# Patient Record
Sex: Female | Born: 1950 | Race: Black or African American | Hispanic: No | Smoking: Never smoker
Health system: Southern US, Community
[De-identification: ages and names within clinical notes are randomized; demographics above are authoritative.]

## PROBLEM LIST (undated history)

## (undated) DIAGNOSIS — F32A Depression, unspecified: Secondary | ICD-10-CM

## (undated) DIAGNOSIS — F329 Major depressive disorder, single episode, unspecified: Secondary | ICD-10-CM

## (undated) DIAGNOSIS — D649 Anemia, unspecified: Secondary | ICD-10-CM

## (undated) DIAGNOSIS — I639 Cerebral infarction, unspecified: Secondary | ICD-10-CM

## (undated) DIAGNOSIS — E785 Hyperlipidemia, unspecified: Secondary | ICD-10-CM

## (undated) DIAGNOSIS — D473 Essential (hemorrhagic) thrombocythemia: Secondary | ICD-10-CM

## (undated) DIAGNOSIS — I10 Essential (primary) hypertension: Secondary | ICD-10-CM

## (undated) DIAGNOSIS — E669 Obesity, unspecified: Secondary | ICD-10-CM

## (undated) DIAGNOSIS — M109 Gout, unspecified: Secondary | ICD-10-CM

## (undated) DIAGNOSIS — J9691 Respiratory failure, unspecified with hypoxia: Secondary | ICD-10-CM

## (undated) DIAGNOSIS — N179 Acute kidney failure, unspecified: Secondary | ICD-10-CM

## (undated) DIAGNOSIS — R5381 Other malaise: Secondary | ICD-10-CM

## (undated) DIAGNOSIS — R7303 Prediabetes: Secondary | ICD-10-CM

## (undated) DIAGNOSIS — E559 Vitamin D deficiency, unspecified: Secondary | ICD-10-CM

## (undated) DIAGNOSIS — R9431 Abnormal electrocardiogram [ECG] [EKG]: Secondary | ICD-10-CM

## (undated) DIAGNOSIS — M79609 Pain in unspecified limb: Secondary | ICD-10-CM

## (undated) DIAGNOSIS — R5383 Other fatigue: Secondary | ICD-10-CM

## (undated) DIAGNOSIS — N289 Disorder of kidney and ureter, unspecified: Secondary | ICD-10-CM

## (undated) DIAGNOSIS — R000 Tachycardia, unspecified: Secondary | ICD-10-CM

## (undated) DIAGNOSIS — E8881 Metabolic syndrome: Secondary | ICD-10-CM

## (undated) DIAGNOSIS — N189 Chronic kidney disease, unspecified: Secondary | ICD-10-CM

## (undated) DIAGNOSIS — J324 Chronic pansinusitis: Secondary | ICD-10-CM

## (undated) HISTORY — DX: Gout, unspecified: M10.9

## (undated) HISTORY — DX: Depression, unspecified: F32.A

## (undated) HISTORY — DX: Hyperlipidemia, unspecified: E78.5

## (undated) HISTORY — DX: Major depressive disorder, single episode, unspecified: F32.9

## (undated) HISTORY — DX: Metabolic syndrome: E88.81

## (undated) HISTORY — DX: Essential (hemorrhagic) thrombocythemia: D47.3

## (undated) HISTORY — PX: TOTAL VAGINAL HYSTERECTOMY: SHX2548

## (undated) HISTORY — DX: Obesity, unspecified: E66.9

## (undated) HISTORY — DX: Prediabetes: R73.03

## (undated) HISTORY — DX: Other fatigue: R53.83

## (undated) HISTORY — DX: Cerebral infarction, unspecified: I63.9

## (undated) HISTORY — DX: Essential (primary) hypertension: I10

## (undated) HISTORY — DX: Pain in unspecified limb: M79.609

## (undated) HISTORY — DX: Other malaise: R53.81

## (undated) HISTORY — DX: Respiratory failure, unspecified with hypoxia: J96.91

## (undated) HISTORY — DX: Chronic pansinusitis: J32.4

## (undated) HISTORY — DX: Tachycardia, unspecified: R00.0

## (undated) HISTORY — DX: Anemia, unspecified: D64.9

## (undated) HISTORY — DX: Abnormal electrocardiogram (ECG) (EKG): R94.31

## (undated) HISTORY — DX: Chronic kidney disease, unspecified: N18.9

## (undated) HISTORY — DX: Disorder of kidney and ureter, unspecified: N28.9

## (undated) HISTORY — DX: Vitamin D deficiency, unspecified: E55.9

## (undated) HISTORY — DX: Acute kidney failure, unspecified: N17.9

---

## 2002-02-05 ENCOUNTER — Inpatient Hospital Stay (HOSPITAL_COMMUNITY): Admission: RE | Admit: 2002-02-05 | Discharge: 2002-02-08 | Payer: Self-pay | Admitting: Family Medicine

## 2002-02-06 ENCOUNTER — Encounter: Payer: Self-pay | Admitting: Family Medicine

## 2002-02-07 ENCOUNTER — Encounter: Payer: Self-pay | Admitting: Cardiology

## 2002-03-06 ENCOUNTER — Ambulatory Visit (HOSPITAL_COMMUNITY): Admission: RE | Admit: 2002-03-06 | Discharge: 2002-03-06 | Payer: Self-pay | Admitting: Cardiology

## 2002-03-06 ENCOUNTER — Encounter: Payer: Self-pay | Admitting: Cardiology

## 2005-12-06 ENCOUNTER — Ambulatory Visit: Payer: Self-pay | Admitting: Family Medicine

## 2005-12-26 ENCOUNTER — Ambulatory Visit (HOSPITAL_COMMUNITY): Admission: RE | Admit: 2005-12-26 | Discharge: 2005-12-26 | Payer: Self-pay | Admitting: Family Medicine

## 2006-07-05 ENCOUNTER — Ambulatory Visit (HOSPITAL_COMMUNITY): Admission: RE | Admit: 2006-07-05 | Discharge: 2006-07-05 | Payer: Self-pay | Admitting: Family Medicine

## 2006-08-23 ENCOUNTER — Ambulatory Visit: Payer: Self-pay | Admitting: Family Medicine

## 2006-12-28 ENCOUNTER — Emergency Department (HOSPITAL_COMMUNITY): Admission: EM | Admit: 2006-12-28 | Discharge: 2006-12-28 | Payer: Self-pay | Admitting: Emergency Medicine

## 2007-02-13 ENCOUNTER — Ambulatory Visit: Payer: Self-pay | Admitting: Family Medicine

## 2007-08-08 ENCOUNTER — Encounter: Payer: Self-pay | Admitting: Family Medicine

## 2007-08-08 ENCOUNTER — Ambulatory Visit (HOSPITAL_COMMUNITY): Admission: RE | Admit: 2007-08-08 | Discharge: 2007-08-08 | Payer: Self-pay | Admitting: Family Medicine

## 2007-08-08 LAB — CONVERTED CEMR LAB
Albumin: 4.1 g/dL (ref 3.5–5.2)
Basophils Absolute: 0 10*3/uL (ref 0.0–0.1)
Bilirubin, Direct: 0.1 mg/dL (ref 0.0–0.3)
CO2: 22 meq/L (ref 19–32)
Calcium: 9.1 mg/dL (ref 8.4–10.5)
Chloride: 103 meq/L (ref 96–112)
Eosinophils Relative: 4 % (ref 0–5)
Glucose, Bld: 85 mg/dL (ref 70–99)
HCT: 32.9 % — ABNORMAL LOW (ref 36.0–46.0)
HDL: 46 mg/dL (ref >39)
Hemoglobin: 10.3 g/dL — ABNORMAL LOW (ref 12.0–15.0)
LDL Cholesterol: 114 mg/dL — ABNORMAL HIGH (ref 0–99)
Lymphocytes Relative: 33 % (ref 12–46)
Lymphs Abs: 1.6 10*3/uL (ref 0.7–3.3)
Neutro Abs: 2.4 10*3/uL (ref 1.7–7.7)
Platelets: 340 10*3/uL (ref 150–400)
RDW: 15 % — ABNORMAL HIGH (ref 11.5–14.0)
Sodium: 138 meq/L (ref 135–145)
TSH: 1.727 microintl units/mL (ref 0.350–5.50)
Total Bilirubin: 0.3 mg/dL (ref 0.3–1.2)
Total CHOL/HDL Ratio: 4
VLDL: 24 mg/dL (ref 0–40)
WBC: 4.7 10*3/uL (ref 4.0–10.5)

## 2007-08-09 ENCOUNTER — Encounter: Payer: Self-pay | Admitting: Family Medicine

## 2007-08-09 LAB — CONVERTED CEMR LAB
Iron: 61 ug/dL (ref 42–145)
Saturation Ratios: 20 % (ref 20–55)
TIBC: 304 ug/dL (ref 250–470)
UIBC: 243 ug/dL

## 2007-08-22 ENCOUNTER — Ambulatory Visit: Payer: Self-pay | Admitting: Family Medicine

## 2007-11-22 ENCOUNTER — Encounter: Payer: Self-pay | Admitting: Family Medicine

## 2008-02-28 ENCOUNTER — Telehealth (INDEPENDENT_AMBULATORY_CARE_PROVIDER_SITE_OTHER): Payer: Self-pay | Admitting: Internal Medicine

## 2008-03-07 ENCOUNTER — Ambulatory Visit: Payer: Self-pay | Admitting: Family Medicine

## 2008-03-13 DIAGNOSIS — E669 Obesity, unspecified: Secondary | ICD-10-CM | POA: Insufficient documentation

## 2008-03-13 DIAGNOSIS — I10 Essential (primary) hypertension: Secondary | ICD-10-CM

## 2008-03-13 DIAGNOSIS — F3289 Other specified depressive episodes: Secondary | ICD-10-CM

## 2008-03-13 DIAGNOSIS — E785 Hyperlipidemia, unspecified: Secondary | ICD-10-CM | POA: Insufficient documentation

## 2008-03-13 DIAGNOSIS — F329 Major depressive disorder, single episode, unspecified: Secondary | ICD-10-CM

## 2008-03-13 HISTORY — DX: Major depressive disorder, single episode, unspecified: F32.9

## 2008-03-13 HISTORY — DX: Essential (primary) hypertension: I10

## 2008-03-13 HISTORY — DX: Other specified depressive episodes: F32.89

## 2008-03-13 HISTORY — DX: Obesity, unspecified: E66.9

## 2009-02-25 ENCOUNTER — Ambulatory Visit: Payer: Self-pay | Admitting: Family Medicine

## 2009-02-25 DIAGNOSIS — M79609 Pain in unspecified limb: Secondary | ICD-10-CM

## 2009-02-25 DIAGNOSIS — R5381 Other malaise: Secondary | ICD-10-CM

## 2009-02-25 DIAGNOSIS — R5383 Other fatigue: Secondary | ICD-10-CM

## 2009-02-25 HISTORY — DX: Other malaise: R53.81

## 2009-02-25 HISTORY — DX: Pain in unspecified limb: M79.609

## 2009-10-13 ENCOUNTER — Encounter: Payer: Self-pay | Admitting: Family Medicine

## 2010-02-04 ENCOUNTER — Telehealth: Payer: Self-pay | Admitting: Family Medicine

## 2010-02-06 ENCOUNTER — Encounter: Payer: Self-pay | Admitting: Family Medicine

## 2010-02-11 ENCOUNTER — Encounter: Payer: Self-pay | Admitting: Family Medicine

## 2010-02-11 LAB — CONVERTED CEMR LAB
ALT: 11 units/L (ref 0–35)
AST: 14 units/L (ref 0–37)
BUN: 22 mg/dL (ref 6–23)
Basophils Absolute: 0 10*3/uL (ref 0.0–0.1)
Basophils Relative: 0 % (ref 0–1)
Bilirubin, Direct: <0.1 mg/dL (ref 0.0–0.3)
Calcium: 9 mg/dL (ref 8.4–10.5)
Cholesterol: 194 mg/dL (ref 0–200)
Glucose, Bld: 85 mg/dL (ref 70–99)
Hemoglobin: 10.1 g/dL — ABNORMAL LOW (ref 12.0–15.0)
Lymphocytes Relative: 34 % (ref 12–46)
MCHC: 30.9 g/dL (ref 30.0–36.0)
Monocytes Absolute: 0.7 10*3/uL (ref 0.1–1.0)
Neutro Abs: 2.5 10*3/uL (ref 1.7–7.7)
Platelets: 319 10*3/uL (ref 150–400)
RDW: 15.6 % — ABNORMAL HIGH (ref 11.5–15.5)
Sodium: 138 meq/L (ref 135–145)
Total CHOL/HDL Ratio: 4.5
Total Protein: 8.1 g/dL (ref 6.0–8.3)
Triglycerides: 120 mg/dL (ref <150)

## 2010-02-12 ENCOUNTER — Ambulatory Visit: Payer: Self-pay | Admitting: Family Medicine

## 2010-05-19 ENCOUNTER — Telehealth: Payer: Self-pay | Admitting: Family Medicine

## 2010-06-30 ENCOUNTER — Encounter: Payer: Self-pay | Admitting: Gastroenterology

## 2010-07-22 ENCOUNTER — Ambulatory Visit (HOSPITAL_COMMUNITY): Admission: RE | Admit: 2010-07-22 | Discharge: 2010-07-22 | Payer: Self-pay | Admitting: Family Medicine

## 2010-11-11 ENCOUNTER — Ambulatory Visit: Payer: Self-pay | Admitting: Family Medicine

## 2010-11-12 ENCOUNTER — Encounter: Payer: Self-pay | Admitting: Family Medicine

## 2010-11-15 ENCOUNTER — Encounter: Payer: Self-pay | Admitting: Family Medicine

## 2010-11-17 ENCOUNTER — Encounter: Payer: Self-pay | Admitting: Family Medicine

## 2010-11-18 LAB — CONVERTED CEMR LAB
Bacteria, UA: NONE SEEN
Basophils Absolute: 0 10*3/uL (ref 0.0–0.1)
Basophils Relative: 0 % (ref 0–1)
Calcium: 9.5 mg/dL (ref 8.4–10.5)
Casts: NONE SEEN /lpf
Cholesterol: 221 mg/dL — ABNORMAL HIGH (ref 0–200)
Creatinine, Ser: 1.18 mg/dL (ref 0.40–1.20)
Crystals: NONE SEEN
Eosinophils Absolute: 0.5 10*3/uL (ref 0.0–0.7)
Eosinophils Relative: 7 % — ABNORMAL HIGH (ref 0–5)
HCT: 34.9 % — ABNORMAL LOW (ref 36.0–46.0)
HDL: 46 mg/dL (ref >39)
Hemoglobin: 10.9 g/dL — ABNORMAL LOW (ref 12.0–15.0)
Hgb A1c MFr Bld: 6.4 % — ABNORMAL HIGH (ref <5.7)
Ketones, ur: NEGATIVE mg/dL
MCHC: 31.2 g/dL (ref 30.0–36.0)
Monocytes Absolute: 0.9 10*3/uL (ref 0.1–1.0)
Nitrite: NEGATIVE
RDW: 15.6 % — ABNORMAL HIGH (ref 11.5–15.5)
Specific Gravity, Urine: 1.016 (ref 1.005–1.030)
Triglycerides: 120 mg/dL (ref <150)
pH: 7 (ref 5.0–8.0)

## 2010-12-01 ENCOUNTER — Encounter: Payer: Self-pay | Admitting: Family Medicine

## 2010-12-11 ENCOUNTER — Encounter: Payer: Self-pay | Admitting: Family Medicine

## 2010-12-12 ENCOUNTER — Encounter: Payer: Self-pay | Admitting: Family Medicine

## 2010-12-21 NOTE — Assessment & Plan Note (Signed)
Summary: OV   Vital Signs:  Patient profile:   60 year old female Menstrual status:  hysterectomy Height:      64 inches Weight:      269 pounds BMI:     46.34 O2 Sat:      95 % Pulse rate:   120 / minute Pulse rhythm:   regular Resp:     16 per minute BP sitting:   140 / 98  (left arm) Cuff size:   xl   Vitals Entered By: Kate Sable LPN (March 25, 624THL [id number] AM)  Nutrition Counseling: Patient's BMI is greater than 25 and therefore counseled on weight management options. CC: Follow up chronic problems   CC:  Follow up chronic problems.  History of Present Illness: Reports  that sh has been  doing well. she is concerned about changing her lifestyle to incorporate regular exercise and ditary change to facilitate imprved blood pressure and weight los. Denies recent fever or chills. Denies sinus pressure, nasal congestion , ear pain or sore throat. Denies chest congestion, or cough productive of sputum. Denies chest pain, palpitations, PND, orthopnea or leg swelling. Denies abdominal pain, nausea, vomitting, diarrhea or constipation. Denies change in bowel movements or bloody stool. Denies dysuria , frequency, incontinence or hesitancy. Denies  joint pain, swelling, or reduced mobility. Denies headaches, vertigo, seizures. Denies depression, anxiety or insomnia. Denies  rash, lesions, or itch.     Current Medications (verified): 1)  Nifedipine 90 Mg  Tb24 (Nifedipine) .... Take 1 Tablet By Mouth Once A Day 2)  Paroxetine Hcl 20 Mg  Tabs (Paroxetine Hcl) .... Take 1 Tablet By Mouth Once A Day 3)  Maxzide 75-50 Mg Tabs (Triamterene-Hctz) .... Take 1 Tablet By Mouth Once A Day 4)  Oscal 500/200 D-3 500-200 Mg-Unit Tabs (Calcium-Vitamin D) .... Take 1 Tablet By Mouth Three Times A Day  Allergies (verified): No Known Drug Allergies  Past History:  Past medical, surgical, family and social histories (including risk factors) reviewed for relevance to current acute and  chronic problems.  Past Medical History: Reviewed history from 03/13/2008 and no changes required. DEPRESSION (ICD-311) DYSLIPIDEMIA (ICD-272.4) OBESITY (ICD-278.00) HYPERTENSION (ICD-401.9)  Past Surgical History: Reviewed history from 03/13/2008 and no changes required. TAH & BSO 1987 BTL 1989  Family History: Reviewed history from 03/13/2008 and no changes required. Mom-deceased CHF,CVA,MI Dad-deceased MI HTN x1 Sisters x3, 1 sister deceased-breast cancer Brothers X7 , 1 brother deceased unknown cause  Social History: Reviewed history from 03/13/2008 and no changes required. Divorced employed-secretary Never Smoked Alcohol use-no Drug use-no  Review of Systems      See HPI General:  Complains of fatigue and sleep disorder. Eyes:  Denies blurring, discharge, and red eye. Heme:  Denies abnormal bruising and bleeding. Allergy:  Complains of seasonal allergies.  Physical Exam  General:  Well-developed,obese,in no acute distress; alert,appropriate and cooperative throughout examination HEENT: No facial asymmetry,  EOMI, No sinus tenderness, TM's Clear, oropharynx  pink and moist.   Chest: Clear to auscultation bilaterally.  CVS: S1, S2, No murmurs, No S3.   Abd: Soft, Nontender.  MS: Adequate ROM spine, hips, shoulders and knees.  Ext: No edema.   CNS: CN 2-12 intact, power tone and sensation normal throughout.   Skin: Intact, no visible lesions or rashes.  Psych: Good eye contact, normal affect.  Memory intact, not anxious or depressed appearing.    Impression & Recommendations:  Problem # 1:  SPECIAL SCREENING FOR MALIGNANT NEOPLASMS COLON (ICD-V76.51) Assessment Comment  Only  Orders: Gastroenterology Referral (GI)pt has never had a colonscopy , states she intends to have one at this time  Problem # 2:  DEPRESSION (ICD-311) Assessment: Improved  Her updated medication list for this problem includes:    Paroxetine Hcl 20 Mg Tabs (Paroxetine hcl) .Marland Kitchen...  Take 1 tablet by mouth once a day  Problem # 3:  OBESITY (ICD-278.00) Assessment: Unchanged  Ht: 64 (02/12/2010)   Wt: 269 (02/12/2010)   BMI: 46.34 (02/12/2010)  Problem # 4:  HYPERTENSION (ICD-401.9) Assessment: Deteriorated  Her updated medication list for this problem includes:    Nifedipine 90 Mg Tb24 (Nifedipine) .Marland Kitchen... Take 1 tablet by mouth once a day    Maxzide 75-50 Mg Tabs (Triamterene-hctz) .Marland Kitchen... Take 1 tablet by mouth once a day    Clonidine Hcl 0.1 Mg Tabs (Clonidine hcl) .Marland Kitchen... Take 1 tab by mouth at bedtime, new addution  BP today: 140/98 Prior BP: 142/90 (02/25/2009)  Labs Reviewed: K+: 3.8 (02/06/2010) Creat: : 1.34 (02/06/2010)   Chol: 194 (02/06/2010)   HDL: 43 (02/06/2010)   LDL: 127 (02/06/2010)   TG: 120 (02/06/2010)  Problem # 5:  OTHER SCREENING MAMMOGRAM (ICD-V76.12) Assessment: Comment Only  Orders: Radiology Referral (Radiology)mamo past due  Complete Medication List: 1)  Nifedipine 90 Mg Tb24 (Nifedipine) .... Take 1 tablet by mouth once a day 2)  Paroxetine Hcl 20 Mg Tabs (Paroxetine hcl) .... Take 1 tablet by mouth once a day 3)  Maxzide 75-50 Mg Tabs (Triamterene-hctz) .... Take 1 tablet by mouth once a day 4)  Clonidine Hcl 0.1 Mg Tabs (Clonidine hcl) .... Take 1 tab by mouth at bedtime  Patient Instructions: 1)  CPE in 3 months and 3 weeks. 2)  You are being referred for [facility] and colonscopy. 3)  Pls start OTC calcium 1200mg  daily with vith Vit D daily. 4)  Asprin 81mg  daily. 5)  It is important that you exercise regularly at least 20 minutes 5 to 6 times a week. If you develop chest pain, have severe difficulty breathing, or feel very tired , stop exercising immediately and seek medical attention. 6)  You need to lose weight. Consider a lower calorie diet and regular exercise. Goal is 6 pounds. Prescriptions: PAROXETINE HCL 20 MG  TABS (PAROXETINE HCL) Take 1 tablet by mouth once a day  #30 x 3   Entered by:   Kate Sable LPN   Authorized  by:   Tula Nakayama MD   Signed by:   Kate Sable LPN on [id number]   Method used:   Electronically to        [facility] (retail)       [address]       [place], [place]  [address]       Ph: [phone]       Fax: [phone]   [id number] CLONIDINE HCL 0.1 MG TABS (CLONIDINE HCL) Take 1 tab by mouth at bedtime  #30 x 3   Entered and Authorized by:   Tula Nakayama MD   Signed by:   Tula Nakayama MD on 02/12/2010   Method used:   Electronically to        [facility] (retail)       [address]       [place], [place]  [address]       Ph: [phone]  Fax: [phone]   RxIDVR:[id number]

## 2010-12-21 NOTE — Letter (Signed)
Summary: OFFICE NOTES  OFFICE NOTES   Imported By: Dierdre Harness 04/21/2010 15:36:10  _____________________________________________________________________  External Attachment:    Type:   Image     Comment:   External Document

## 2010-12-21 NOTE — Letter (Signed)
Summary: DEMO  DEMO   Imported By: Dierdre Harness 04/21/2010 15:34:41  _____________________________________________________________________  External Attachment:    Type:   Image     Comment:   External Document

## 2010-12-21 NOTE — Progress Notes (Signed)
Summary: RX  Phone Note Call from Patient   Summary of Call: [facility] HAS NOT RECEIVEDHER RX FOR NIFEDIPINE AND TRIAMT [facility] IN [place] Initial call taken by: Dierdre Harness,  May 19, 2010 9:10 AM  Follow-up for Phone Call        Rx Called In Follow-up by: Baldomero Lamy LPN,  June 29, 624THL [id number] AM    Prescriptions: MAXZIDE 75-50 MG TABS (TRIAMTERENE-HCTZ) Take 1 tablet by mouth once a day  #30 x 0   Entered by:   Baldomero Lamy LPN   Authorized by:   Tula Nakayama MD   Signed by:   Baldomero Lamy LPN on 075-GRM   Method used:   Electronically to        [facility] (retail)       [address]       [place], [place]  [address]       Ph: [phone]       Fax: [phone]   [id number] NIFEDIPINE 90 MG  TB24 (NIFEDIPINE) Take 1 tablet by mouth once a day  #30 x 0   Entered by:   Baldomero Lamy LPN   Authorized by:   Tula Nakayama MD   Signed by:   Baldomero Lamy LPN on 075-GRM   Method used:   Electronically to        [facility]  [address] (retail)       [address]       [place], [place]  [address]       Ph: [phone]       Fax: [phone]   [id number]

## 2010-12-21 NOTE — Letter (Signed)
Summary: consults  consults   Imported By: Dierdre Harness 04/21/2010 15:41:46  _____________________________________________________________________  External Attachment:    Type:   Image     Comment:   External Document

## 2010-12-21 NOTE — Letter (Signed)
Summary: HISTORY AND PHY  HISTORY AND PHY   Imported By: Dierdre Harness 04/21/2010 15:35:40  _____________________________________________________________________  External Attachment:    Type:   Image     Comment:   External Document

## 2010-12-21 NOTE — Letter (Signed)
Summary: LABS  LABS   Imported By: Dierdre Harness 04/21/2010 15:42:27  _____________________________________________________________________  External Attachment:    Type:   Image     Comment:   External Document

## 2010-12-21 NOTE — Progress Notes (Signed)
Summary: LAB ORDERS  Phone Note Call from Patient   Summary of Call: DR WANTS HER TO COME IN SO SHE IS COMING NEXT WEEK AND NEEDS HER LAB FAXED OVER BECAUSE SHE IS GOING OVER THERE SATURDAY CALL HER BACK AT [phone] TO LET HER [place] Initial call taken by: Dierdre Harness,  February 04, 2010 11:40 AM  Follow-up for Phone Call        order sent and patient aware Follow-up by: Baldomero Lamy LPN,  March 17, 624THL 11:47 AM

## 2010-12-21 NOTE — Letter (Signed)
Summary: MISC  MISC   Imported By: Dierdre Harness 04/21/2010 15:42:57  _____________________________________________________________________  External Attachment:    Type:   Image     Comment:   External Document

## 2010-12-21 NOTE — Letter (Signed)
Summary: PHONE NOTES  PHONE NOTES   Imported By: Dierdre Harness 04/21/2010 15:43:30  _____________________________________________________________________  External Attachment:    Type:   Image     Comment:   External Document

## 2010-12-21 NOTE — Letter (Signed)
Summary: Internal Other Lynne Logan  Internal Other Lynne Logan   Imported By: Waldon Merl LPN 624THL [id number]  _____________________________________________________________________  External Attachment:    Type:   Image     Comment:   External Document

## 2010-12-23 NOTE — Letter (Signed)
Summary: lab add on  lab add on   Imported By: Luann Bullins 12/01/2010 13:22:00  _____________________________________________________________________  External Attachment:    Type:   Image     Comment:   External Document

## 2010-12-23 NOTE — Assessment & Plan Note (Signed)
Summary: ov   Vital Signs:  Patient profile:   60 year old female Menstrual status:  hysterectomy Height:      64 inches Weight:      261.50 pounds BMI:     45.05 O2 Sat:      97 % on Room air Pulse rate:   101 / minute Pulse rhythm:   regular BP sitting:   136 / 84  (left arm)  Vitals Entered By: Baldomero Lamy LPN (December 22, 624THL 9:26 AM)  Nutrition Counseling: Patient's BMI is greater than 25 and therefore counseled on weight management options.  O2 Flow:  Room air CC: follow-up visit Is Patient Diabetic? No   CC:  follow-up visit.  History of Present Illness: Reports  that t she has been doing fairly well. Denies recent fever or chills. Denies sinus pressure, nasal congestion , ear pain or sore throat. Denies chest congestion, or cough productive of sputum. Denies chest pain, palpitations, PND, orthopnea or leg swelling. Denies abdominal pain, nausea, vomitting, diarrhea or constipation. Denies change in bowel movements or bloody stool. Denies dysuria , frequency, incontinence or hesitancy. Denies  joint pain, swelling, or reduced mobility. Denies headaches, vertigo, seizures. Denies depression, anxiety or insomnia.Controlled on meds. Denies  rash, lesions, or itch.     Current Medications (verified): 1)  Nifedipine 90 Mg  Tb24 (Nifedipine) .... Take 1 Tablet By Mouth Once A Day 2)  Paroxetine Hcl 20 Mg  Tabs (Paroxetine Hcl) .... Take 1 Tablet By Mouth Once A Day 3)  Maxzide 75-50 Mg Tabs (Triamterene-Hctz) .... Take 1 Tablet By Mouth Once A Day 4)  Aspir-Low 81 Mg Tbec (Aspirin) .... One Tab By Mouth Once Daily  Allergies (verified): No Known Drug Allergies  Review of Systems      See HPI Eyes:  Denies discharge, eye pain, and red eye. Endo:  Denies cold intolerance, excessive hunger, excessive thirst, and excessive urination. Heme:  Denies abnormal bruising and bleeding. Allergy:  Denies hives or rash and itching eyes.  Physical Exam  General:   Well-developed,obese,in no acute distress; alert,appropriate and cooperative throughout examination HEENT: No facial asymmetry,  EOMI, No sinus tenderness, TM's Clear, oropharynx  pink and moist.   Chest: Clear to auscultation bilaterally.  CVS: S1, S2, No murmurs, No S3.   Abd: Soft, Nontender.  MS: Adequate ROM spine, hips, shoulders and knees.  Ext: No edema.   CNS: CN 2-12 intact, power tone and sensation normal throughout.   Skin: Intact, no visible lesions or rashes.  Psych: Good eye contact, normal affect.  Memory intact, not anxious or depressed appearing.    Impression & Recommendations:  Problem # 1:  DEPRESSION (ICD-311) Assessment Improved  Her updated medication list for this problem includes:    Paroxetine Hcl 20 Mg Tabs (Paroxetine hcl) .Marland Kitchen... Take 1 tablet by mouth once a day  Problem # 2:  OBESITY (ICD-278.00) Assessment: Improved  Ht: 64 (11/11/2010)   Wt: 261.50 (11/11/2010)   BMI: 45.05 (11/11/2010) therapeutic lifestyle change discussed and encouraged  Problem # 3:  HYPERTENSION (ICD-401.9) Assessment: Improved  The following medications were removed from the medication list:    Clonidine Hcl 0.1 Mg Tabs (Clonidine hcl) .Marland Kitchen... Take 1 tab by mouth at bedtime Her updated medication list for this problem includes:    Nifedipine 90 Mg Tb24 (Nifedipine) .Marland Kitchen... Take 1 tablet by mouth once a day    Maxzide 75-50 Mg Tabs (Triamterene-hctz) .Marland Kitchen... Take 1 tablet by mouth once a day  Clonidine Hcl 0.1 Mg Tabs (Clonidine hcl) .Marland Kitchen... Take 1 tab by mouth at bedtime  Orders: T-Basic Metabolic Panel (99991111) T-Urinalysis with Culture Reflex (65001)  BP today: 136/84 Prior BP: 140/98 (02/12/2010)  Labs Reviewed: K+: 3.8 (02/06/2010) Creat: : 1.34 (02/06/2010)   Chol: 194 (02/06/2010)   HDL: 43 (02/06/2010)   LDL: 127 (02/06/2010)   TG: 120 (02/06/2010)  Problem # 4:  DYSLIPIDEMIA (ICD-272.4) Assessment: Comment Only  Orders: T-Lipid Profile  [id number])  Labs Reviewed: SGOT: 14 (02/06/2010)   SGPT: 11 (02/06/2010)   HDL:43 (02/06/2010), 46 (08/08/2007)  LDL:127 (02/06/2010), 114 (08/08/2007)  Chol:194 (02/06/2010), 184 (08/08/2007)  Trig:120 (02/06/2010), 120 (08/08/2007) Low fat dietdiscussed and encouraged  Complete Medication List: 1)  Nifedipine 90 Mg Tb24 (Nifedipine) .... Take 1 tablet by mouth once a day 2)  Paroxetine Hcl 20 Mg Tabs (Paroxetine hcl) .... Take 1 tablet by mouth once a day 3)  Maxzide 75-50 Mg Tabs (Triamterene-hctz) .... Take 1 tablet by mouth once a day 4)  Aspir-low 81 Mg Tbec (Aspirin) .... One tab by mouth once daily 5)  Multivitamin & Mineral Liqd (Multiple vitamins-minerals) .... One teaspoon once daily 6)  Calcium 1200mg  With Vitamin D 1000iu  .... One gel capsule once daily 7)  Clonidine Hcl 0.1 Mg Tabs (Clonidine hcl) .... Take 1 tab by mouth at bedtime  Other Orders: T-CBC w/Diff [id number]) T- Hemoglobin A1C [id number]) T-Vitamin D (25-Hydroxy) [id number])  Patient Instructions: 1)  Follow up appointment in 5.80months 2)  It is important that you exercise regularly at least 20 minutes 5 times a week. If you develop chest pain, have severe difficulty breathing, or feel very tired , stop exercising immediately and seek medical attention. 3)  You need to lose weight. Consider a lower calorie diet and regular exercise. Congrats on 8 pound weight loss, pls keep it up 4)  BMP prior to visit, ICD-9: 5)  Lipid Panel prior to visit, ICD-9: 6)  CBC w/ Diff prior to visit, ICD-9:  fasting today 7)  Urine-dip prior to visit, ICD-9: 8)  HbgA1C prior to visit, ICD-9: 9)  vit D Prescriptions: MAXZIDE 75-50 MG TABS (TRIAMTERENE-HCTZ) Take 1 tablet by mouth once a day  #60 Each x 5   Entered by:   Baldomero Lamy LPN   Authorized by:   Tula Nakayama MD   Signed by:   Baldomero Lamy LPN on [id number]   Method used:   Electronically to        [facility] (retail)       [address] [place], [place]  [address]       Ph: [phone]       Fax: [phone]   RxIDIT:[id number] PAROXETINE HCL 20 MG  TABS (PAROXETINE HCL) Take 1 tablet by mouth once a day  #30 Each x 5   Entered by:   Baldomero Lamy LPN   Authorized by:   Tula Nakayama MD   Signed by:   Baldomero Lamy LPN on [id number]   Method used:   Electronically to        [facility] [address] (retail)       [address]       [place], [place]  [address]       Ph: [phone]       Fax: [phone]   RxID:  [id number] NIFEDIPINE 90 MG  TB24 (NIFEDIPINE) Take 1 tablet by mouth once a day  #30 Each x 5   Entered by:   Baldomero Lamy LPN   Authorized by:   Tula Nakayama MD   Signed by:   Baldomero Lamy LPN on [id number]   Method used:   Electronically to        [facility] (retail)       [place] [address]       [place], [place]  [address]       Ph: [phone]       Fax: [phone]   [id number] CLONIDINE HCL 0.1 MG TABS (CLONIDINE HCL) Take 1 tab by mouth at bedtime  #30 x 5   Entered and Authorized by:   Tula Nakayama MD   Signed by:   Tula Nakayama MD on 11/11/2010   Method used:   Electronically to        [facility] (retail)       [address]       [place], [place]  [address]       Ph: [phone]       Fax: [phone]   RxID:   [id number] MULTIVITAMIN & MINERAL  LIQD (MULTIPLE VITAMINS-MINERALS) one teaspoon once daily  #450cc x 3   Entered and Authorized by:   Tula Nakayama MD   Signed by:   Tula Nakayama MD on 11/11/2010   Method used:   Electronically to        [facility] (retail)       [address]       [place],   [address]       Ph: [phone]       Fax: [phone]   RxID:   [id number]    Orders Added: 1)  Est. Patient Level IV [id number] 2)  T-Basic Metabolic Panel 0000000 3)  T-Lipid Profile  [80061-22930] 4)  T-CBC w/Diff [id number] 5)  T- Hemoglobin A1C [83036-23375] 6)  T-Vitamin D (25-Hydroxy) [id number] 7)  T-Urinalysis with Culture Reflex [id number]

## 2010-12-23 NOTE — Letter (Signed)
Summary: add lab on  add lab on   Imported By: Luann Bullins 11/17/2010 17:02:03  _____________________________________________________________________  External Attachment:    Type:   Image     Comment:   External Document

## 2010-12-23 NOTE — Miscellaneous (Signed)
  Clinical Lists Changes  Medications: Added new medication of VITAMIN D (ERGOCALCIFEROL) 50000 UNIT CAPS (ERGOCALCIFEROL) one capsule once weekly - Signed Rx of VITAMIN D (ERGOCALCIFEROL) 50000 UNIT CAPS (ERGOCALCIFEROL) one capsule once weekly;  #4 x 5;  Signed;  Entered by: Tula Nakayama MD;  Authorized by: Tula Nakayama MD;  Method used: Historical    Prescriptions: VITAMIN D (ERGOCALCIFEROL) 50000 UNIT CAPS (ERGOCALCIFEROL) one capsule once weekly  #4 x 5   Entered and Authorized by:   Tula Nakayama MD   Signed by:   Tula Nakayama MD on 11/15/2010   Method used:   Historical   [id number]

## 2011-04-08 NOTE — Discharge Summary (Signed)
[facility]  Patient:    Angel Davis, Angel Davis Visit Number: [id number] MRN: [id number]          Service Type: MED Location: 2A A227 01 Attending Physician:  Tula Nakayama Dictated by:   Tula Nakayama, M.D. Admit Date:  02/05/2002 Discharge Date: 02/08/2002                             Discharge Summary  DISCHARGE DIAGNOSIS:  Severe hypertension.  SECONDARY DIAGNOSES: 1. Hyperlipidemia. 2. Generalized anxiety disorder. 3. Obesity.  BRIEF HISTORY:  The patient is a 60 year old African-American female who was admitted for severe hypertension.  The patient had been evaluated in the office 1 day prior to her admission and at that time her blood pressure was 230/120.  She was given avelide 300/12.5 in the office and tiazac 300 mg was tapered the same evening.  A repeat blood pressure check the following morning revealed an unchanged blood pressure of 230/130, hence the reason for admission.  The patient was totally asymptomatic with her elevated blood pressure.  She denied any headache.  She denied any blurred vision.  She denied any weakness or numbness.  She stated she had initially been diagnosed with hypertension in 1981, however, she did not take any medication on a regular basis.  MEDICATIONS:  At the time of her admission were as follows: 1. Avalide 300/12.5 1 daily. 2. Tiazac 300 mg 1 daily. 3. Dyazide 80 mg 1 with tapering up to the day before her admission. 4. Paxil 25 mg daily.  ALLERGIES:  Stated as none known to drugs or food.  PAST SURGICAL HISTORY: 1. Bilateral tubal ligation. 2. Total abdominal hysterectomy with sparing of the ovaries.  REVIEW OF SYSTEMS:  Negative for the cardiovascular system for chest pain, palpitations, leg edema, PND or orthopnea.  Respiratory history was negative for sinus pressure, sore throat, or any cough.  GU:  She denied any dysuria and reported a good urinary stream and no frequency.  GI: REvealed a  good appetite with regular bowel movements and no rectal bleeding.  No change in bowel movements either.  CNS:  Revealed negative headache, weakness or numbness.  PHYSICAL EXAMINATION:  GENERAL:  She was alert and oriented x4.  VITAL SIGNS:  The blood pressure in the ICU when I checked several hours after her admission was 170/110.  Heart rate was 72, respirations 20, and temperature 98.0.  HEENT:  Extraocular movements were intact.  There was no facial asymmetry. Oropharynx was moist.  NECK:  Supple without adenopathy, no JVD.  CARDIOVASCULAR:  Heart sounds 1 and 2 heard.  No murmurs, no S3.  CHEST:  Revealed adequate air entry with no crackles or wheezes heard.  ABDOMEN:  Obese, soft, nontender.  Bowel sounds were present and normal. There was no palpable organomegaly or masses.  There were no abdominal bruits heard.  EXTREMITIES:  Negative for edema or calf tenderness.  LABORATORY DATA:  Hemoglobin was 11.6 with a white cell count of 6.7 and a platelet count of 322.  Chemistries:  sodium 137, potassium 4.2, chloride 104, CO2 27, BUN 6, creatinine 0.9, and glucose 122.  Hepatic panel was normal.  HOSPITAL COURSE:  The patient was admitted to intensive care unit and cardiology was consulted to help with the management of what was, in my impression, accelerated hypertension.  Cardiology did evaluate her on the day of her admission and an order was written IV labetalol to be  given full push if her systolic blood pressure was constantly over 200 over a 2 minute period and for her to be started on a labetalol drip  at 2 mg/min to be held for a heart rate less than 56 or for systolic blood pressure less than 110.  She was also started on atenolol 25 mg daily and clonidine was written for 0.1 mg twice daily for a systolic blood pressure greater than 170.  The day following her admission despite normal readings per the automated blood pressure cuff in the patients room her  blood pressure was still found to be markedly elevated.  When done manually this was discussed with the cardiologist available who was Dr. Verl Blalock and they decided to increase her clonidine to 0.2 mg 3 times daily and atenolol to 25 mg twice daily.  The dosing of avapro was kept at 300 mg and hydrochlorothiazide kept at 25 mg.  The patient blood pressure normalized by the time of her discharge and on the day of her discharge the blood pressure which I recorded was 150/72, and 152/84 manually.  At this level it was decided that she would be followed as an outpatient.  She remained asymptomatic, denied any lightheadedness, and was discharged home on the medications which she had received in the hospital.  LABORATORY DATA:  Other lab data which was collected while she was in hospital was a 24 hour urine collection for VMA which was pretty much in a normal range, the VMA recorded was 8.0 which 1 above the normal upper limits, however, said elevation was reported as being caused by a variety of factors such as essential hypertension which the patient definitely does have, and it was stated that ______ must be present with elevation several times with a ______ reference in it.  Her 24 hour urine creatinine was high at 1898 where the upper normal is 1600. She had a fasting lipid profile which showed elevation of her total cholesterol as well as the LDL cholesterol.  Her total cholesterol was 204, and the LDL was 134.  Her triglycerides were 74, HDL 55, and VLDL 15.  Because of her risk factors which included severe hypertension and obesity we opted to start her on Zocor 20 mg at bedtime and well as we had a dietary consultation to discuss restrictive caloric intake, low sodium diet and low fat diet along with exercise for weight loss.  The patient also had a TSH during the admission which was totally normal at 2.383.  Radiologic studies included a chest x-ray which showed borderline  cardiomegaly with no active disease.  The ultrasound of her renal and aortic area showed a  normal left renal ultrasound with very mild right hydronephrosis, aorta had normal caliber.  Her EKG on admission showed a normal sinus rhythm with a rate of 82.  and her EKG on the day of discharge showed normal sinus rhythm with a rate of 63.  The patient also had an echocardiogram done on the morning of her admission. However, the report, read by Dr. Ron Parker, was that she had an ejection fraction of 65%  DISPOSITION:  Home.  DISCHARGE MEDICATIONS: 1. Avapro 300 mg 1 daily. 2. Hydrochlorothiazide 25 mg daily. 3. Atenolol 25 mg twice daily. 4. Clonidine 0.2 mg 3 times daily. 5. Zocor 20 mg at bedtime. 6. Paxil CR 25 mg daily.  DIET:  She is on a low sodium 1500 calorie diet.  ACTIVITY:  She is to exercise for 20 minutes at least  5 days a week.  FOLLOWUP: 1. She is to have an MRA at [facility] as an outpatient. 2. Follow up with Dr. Verl Blalock in cardiology on February 21, 2002, at 12:15 and with    Dr. Moshe Cipro her primary physician on March 15, 2002, at 1 p.m. Dictated by:   Tula Nakayama, M.D. Attending Physician:  Tula Nakayama DD:  02/18/02 TD:  02/18/02 Job: [id number]

## 2011-09-07 ENCOUNTER — Other Ambulatory Visit: Payer: Self-pay | Admitting: Family Medicine

## 2011-09-09 ENCOUNTER — Telehealth: Payer: Self-pay | Admitting: Family Medicine

## 2011-09-13 NOTE — Telephone Encounter (Signed)
Needs to wait until her next appt. Was supposed to come back the beginning of the year but did not. Has not been here in over 10 months

## 2011-09-21 ENCOUNTER — Encounter: Payer: Self-pay | Admitting: Family Medicine

## 2011-09-26 ENCOUNTER — Ambulatory Visit: Payer: Self-pay | Admitting: Family Medicine

## 2011-11-12 ENCOUNTER — Other Ambulatory Visit: Payer: Self-pay | Admitting: Family Medicine

## 2011-11-14 ENCOUNTER — Other Ambulatory Visit: Payer: Self-pay

## 2011-11-14 MED ORDER — NIFEDIPINE ER OSMOTIC RELEASE 90 MG PO TB24: 90.0000 mg | ORAL_TABLET | Freq: Every day | ORAL | Status: DC

## 2011-12-19 ENCOUNTER — Other Ambulatory Visit: Payer: Self-pay | Admitting: Family Medicine

## 2011-12-20 ENCOUNTER — Other Ambulatory Visit: Payer: Self-pay | Admitting: Family Medicine

## 2011-12-20 ENCOUNTER — Telehealth: Payer: Self-pay | Admitting: Family Medicine

## 2011-12-20 MED ORDER — PAROXETINE HCL 20 MG PO TABS: 20.0000 mg | ORAL_TABLET | Freq: Every day | ORAL | Status: DC

## 2011-12-20 NOTE — Telephone Encounter (Signed)
Med sent to [facility] let her know pls

## 2011-12-20 NOTE — Telephone Encounter (Signed)
Pt is asking for refill of paxil. She has a scheduled appt for 2/5.  I cant find the dosage of her paxil.

## 2011-12-20 NOTE — Telephone Encounter (Signed)
Left message notifying pt of approved refill

## 2011-12-22 ENCOUNTER — Encounter: Payer: Self-pay | Admitting: Family Medicine

## 2011-12-27 ENCOUNTER — Encounter: Payer: Self-pay | Admitting: Family Medicine

## 2011-12-27 ENCOUNTER — Ambulatory Visit (INDEPENDENT_AMBULATORY_CARE_PROVIDER_SITE_OTHER): Payer: BC Managed Care – PPO | Admitting: Family Medicine

## 2011-12-27 ENCOUNTER — Other Ambulatory Visit: Payer: Self-pay | Admitting: Family Medicine

## 2011-12-27 VITALS — BP 180/110 | HR 106 | Resp 18 | Ht 64.0 in | Wt 251.0 lb

## 2011-12-27 DIAGNOSIS — R7309 Other abnormal glucose: Secondary | ICD-10-CM

## 2011-12-27 DIAGNOSIS — E669 Obesity, unspecified: Secondary | ICD-10-CM

## 2011-12-27 DIAGNOSIS — E559 Vitamin D deficiency, unspecified: Secondary | ICD-10-CM

## 2011-12-27 DIAGNOSIS — Z139 Encounter for screening, unspecified: Secondary | ICD-10-CM

## 2011-12-27 DIAGNOSIS — I10 Essential (primary) hypertension: Secondary | ICD-10-CM

## 2011-12-27 DIAGNOSIS — D239 Other benign neoplasm of skin, unspecified: Secondary | ICD-10-CM

## 2011-12-27 DIAGNOSIS — R7303 Prediabetes: Secondary | ICD-10-CM

## 2011-12-27 DIAGNOSIS — E785 Hyperlipidemia, unspecified: Secondary | ICD-10-CM

## 2011-12-27 DIAGNOSIS — R9431 Abnormal electrocardiogram [ECG] [EKG]: Secondary | ICD-10-CM

## 2011-12-27 DIAGNOSIS — Z23 Encounter for immunization: Secondary | ICD-10-CM

## 2011-12-27 DIAGNOSIS — Z2911 Encounter for prophylactic immunotherapy for respiratory syncytial virus (RSV): Secondary | ICD-10-CM

## 2011-12-27 DIAGNOSIS — Z1211 Encounter for screening for malignant neoplasm of colon: Secondary | ICD-10-CM

## 2011-12-27 DIAGNOSIS — F329 Major depressive disorder, single episode, unspecified: Secondary | ICD-10-CM

## 2011-12-27 DIAGNOSIS — D229 Melanocytic nevi, unspecified: Secondary | ICD-10-CM

## 2011-12-27 MED ORDER — CLONIDINE HCL 0.3 MG PO TABS: ORAL_TABLET | ORAL | Status: DC

## 2011-12-27 NOTE — Assessment & Plan Note (Addendum)
Low fat diet discussed and encouraged 

## 2011-12-27 NOTE — Assessment & Plan Note (Signed)
Controlled, no change in medication  

## 2011-12-27 NOTE — Patient Instructions (Signed)
F/u in 5 weeks.  Blood pressure is high, need to start additional medication, clonidine at 9pm every night.   EKG today.  Zostavax today.  Fasting labs in the morning.  You will have mammogram scheduled and you are referred for a colonoscopy, both are important  It is important that you exercise regularly at least 30 minutes 5 times a week. If you develop chest pain, have severe difficulty breathing, or feel very tired, stop exercising immediately and seek medical attention  A healthy diet is rich in fruit, vegetables and whole grains. Poultry fish, nuts and beans are a healthy choice for protein rather then red meat. A low sodium diet and drinking 64 ounces of water daily is generally recommended. Oils and sweet should be limited. Carbohydrates especially for those who are diabetic or overweight, should be limited to 34-45 gram per meal. It is important to eat on a regular schedule, at least 3 times daily. Snacks should be primarily fruits, vegetables or nuts. Weight loss goal of 2 to 3 pounds per month

## 2011-12-27 NOTE — Assessment & Plan Note (Addendum)
Uncontrolled, pt non compliant , importance of same stressed

## 2011-12-28 LAB — COMPREHENSIVE METABOLIC PANEL
AST: 13 U/L (ref 0–37)
BUN: 20 mg/dL (ref 6–23)
CO2: 25 mEq/L (ref 19–32)
Calcium: 9.3 mg/dL (ref 8.4–10.5)
Chloride: 102 mEq/L (ref 96–112)
Creat: 1.35 mg/dL — ABNORMAL HIGH (ref 0.50–1.10)

## 2011-12-28 LAB — CBC
HCT: 33.5 % — ABNORMAL LOW (ref 36.0–46.0)
MCH: 24.5 pg — ABNORMAL LOW (ref 26.0–34.0)
MCHC: 30.7 g/dL (ref 30.0–36.0)
MCV: 79.6 fL (ref 78.0–100.0)
RDW: 15.7 % — ABNORMAL HIGH (ref 11.5–15.5)

## 2011-12-28 LAB — LIPID PANEL
Cholesterol: 188 mg/dL (ref 0–200)
HDL: 42 mg/dL (ref >39)
Total CHOL/HDL Ratio: 4.5 Ratio

## 2011-12-30 ENCOUNTER — Other Ambulatory Visit: Payer: Self-pay | Admitting: Family Medicine

## 2012-01-03 ENCOUNTER — Ambulatory Visit (HOSPITAL_COMMUNITY)
Admission: RE | Admit: 2012-01-03 | Discharge: 2012-01-03 | Disposition: A | Discharge status: Home / Self Care | Payer: BC Managed Care – PPO | Source: Ambulatory Visit | Attending: Family Medicine | Admitting: Family Medicine

## 2012-01-03 DIAGNOSIS — Z139 Encounter for screening, unspecified: Secondary | ICD-10-CM

## 2012-01-03 DIAGNOSIS — Z1231 Encounter for screening mammogram for malignant neoplasm of breast: Secondary | ICD-10-CM | POA: Insufficient documentation

## 2012-01-04 ENCOUNTER — Ambulatory Visit: Payer: BC Managed Care – PPO | Admitting: Cardiology

## 2012-01-08 ENCOUNTER — Encounter: Payer: Self-pay | Admitting: Family Medicine

## 2012-01-08 DIAGNOSIS — R7303 Prediabetes: Secondary | ICD-10-CM | POA: Insufficient documentation

## 2012-01-08 DIAGNOSIS — E559 Vitamin D deficiency, unspecified: Secondary | ICD-10-CM | POA: Insufficient documentation

## 2012-01-08 HISTORY — DX: Prediabetes: R73.03

## 2012-01-08 HISTORY — DX: Vitamin D deficiency, unspecified: E55.9

## 2012-01-08 NOTE — Progress Notes (Signed)
  Subjective:    Patient ID: Angel Davis, female    DOB: Nov 13, 1951, 61 y.o.   MRN: [id number]  HPI The PT is here for follow up and re-evaluation of chronic medical conditions, medication management and review of any available recent lab and radiology data.  Preventive health is updated, specifically  Cancer screening and Immunization.   The PT denies any adverse reactions to current medications since the last visit.  She has no specific concerns. She has been modifying her diet , with some weight loss since her last visit, but is way overdue, as she was prediabetic over 1 year ago and has not returned until now. As usual, she promises to do better in the future       Review of Systems See HPI Denies recent fever or chills. Denies sinus pressure, nasal congestion, ear pain or sore throat. Denies chest congestion, productive cough or wheezing. Denies chest pains, palpitations and leg swelling Denies abdominal pain, nausea, vomiting,diarrhea or constipation.   Denies dysuria, frequency, hesitancy or incontinence. Denies joint pain, swelling and limitation in mobility. Denies headaches, seizures, numbness, or tingling. Denies depression, anxiety or insomnia.Takes medication for this Denies skin break down or rash.        Objective:   Physical Exam Patient alert and oriented and in no cardiopulmonary distress.  HEENT: No facial asymmetry, EOMI, no sinus tenderness,  oropharynx pink and moist.  Neck supple no adenopathy.  Chest: Clear to auscultation bilaterally.  CVS: S1, S2 no murmurs, no S3.  ABD: Soft non tender. Bowel sounds normal.  Ext: No edema  MS: Adequate ROM spine, shoulders, hips and knees.  Skin: Intact, no ulcerations or rash noted.  Psych: Good eye contact, normal affect. Memory intact not anxious or depressed appearing.  CNS: CN 2-12 intact, power, tone and sensation normal throughout.        Assessment & Plan:

## 2012-01-08 NOTE — Assessment & Plan Note (Signed)
Multiple cardiovascular risk factors , will refer to card for further eval

## 2012-01-08 NOTE — Assessment & Plan Note (Signed)
Improved. Pt applauded on succesful weight loss through lifestyle change, and encouraged to continue same. Weight loss goal set for the next several months.  

## 2012-01-08 NOTE — Assessment & Plan Note (Signed)
Prescription med started , based on labwork

## 2012-01-09 ENCOUNTER — Encounter (INDEPENDENT_AMBULATORY_CARE_PROVIDER_SITE_OTHER): Payer: Self-pay | Admitting: *Deleted

## 2012-01-09 ENCOUNTER — Encounter: Payer: Self-pay | Admitting: Cardiology

## 2012-01-10 ENCOUNTER — Encounter: Payer: Self-pay | Admitting: Cardiology

## 2012-01-10 ENCOUNTER — Other Ambulatory Visit: Payer: Self-pay | Admitting: *Deleted

## 2012-01-10 ENCOUNTER — Ambulatory Visit (INDEPENDENT_AMBULATORY_CARE_PROVIDER_SITE_OTHER): Payer: BC Managed Care – PPO | Admitting: Cardiology

## 2012-01-10 DIAGNOSIS — R9431 Abnormal electrocardiogram [ECG] [EKG]: Secondary | ICD-10-CM

## 2012-01-10 DIAGNOSIS — I10 Essential (primary) hypertension: Secondary | ICD-10-CM

## 2012-01-10 DIAGNOSIS — E785 Hyperlipidemia, unspecified: Secondary | ICD-10-CM

## 2012-01-10 DIAGNOSIS — D649 Anemia, unspecified: Secondary | ICD-10-CM | POA: Insufficient documentation

## 2012-01-10 MED ORDER — CLONIDINE HCL 0.1 MG PO TABS: 0.1000 mg | ORAL_TABLET | Freq: Two times a day (BID) | ORAL | Status: DC

## 2012-01-10 MED ORDER — LABETALOL HCL 200 MG PO TABS: 200.0000 mg | ORAL_TABLET | Freq: Two times a day (BID) | ORAL | Status: DC

## 2012-01-10 NOTE — Assessment & Plan Note (Signed)
Lipid profile is not ideal with a total/LDL cholesterol of 188/124; however, in the absence of known vascular disease or diabetes, pharmacologic therapy is not necessary.

## 2012-01-10 NOTE — Assessment & Plan Note (Signed)
Tracing obtained 12/2011 reveals delayed R-wave progression and minor nonspecific ST segment abnormalities.  Although anteroseptal MI is not excluded, abnormal R wave progression is more likely related to lead placement.  An echocardiogram will be performed to exclude a segmental wall motion abnormality and to evaluate LVH in the face of long-standing hypertension.

## 2012-01-10 NOTE — Progress Notes (Signed)
Patient ID: Angel Davis, female   DOB: Mar 29, 1951, 61 y.o.   MRN: [id number] HPI: Initial cardiology visit for this very nice woman seen at the kind request of Dr. Moshe Cipro for evaluation of EKG abnormalities and treatment of resistant hypertension.  Angel Davis was first told of hypertension approximately 30 years ago.  Control has been variable over those 3 decades with patient and not entirely compliant with her medical regime for long periods of time.  She has had no apparent complications of hypertension.  Recent EKG shows delayed R-wave progression suggestive of possible prior anteroseptal MI.  Patient denies chest discomfort or dyspnea.  She is relatively sedentary, but can do her housework or walk up stairs without difficulty.  She has no hyperlipidemia nor diabetes.  She does not smoke cigarettes.  Current Outpatient Prescriptions on File Prior to Visit  Medication Sig Dispense Refill  . aspirin 81 MG tablet Take 81 mg by mouth daily.      Marland Kitchen NIFEdipine (PROCARDIA XL/ADALAT-CC) 90 MG 24 hr tablet TAKE ONE TABLET BY MOUTH EVERY DAY  30 tablet  7  . PARoxetine (PAXIL) 20 MG tablet Take 1 tablet (20 mg total) by mouth daily.  30 tablet  0  . triamterene-hydrochlorothiazide (MAXZIDE) 75-50 MG per tablet Take 1 tablet by mouth daily.      . Vitamin D, Ergocalciferol, (DRISDOL) 50000 UNITS CAPS Take 50,000 Units by mouth every 7 (seven) days.       No Known Allergies    Past Medical History  Diagnosis Date  . Hypertension   . Dyslipidemia   . Depression   . Obesity   . Vitamin d deficiency   . Pre-diabetes     Past Surgical History  Procedure Date  . Total vaginal hysterectomy     Family History  Problem Relation Age of Onset  . Heart attack Father   . Cancer Sister   . Heart attack Mother   . Hypertension Sister   . Hypertension Brother     History   Social History  . Marital Status: Divorced    Spouse Name: N/A    Number of Children: N/A  . Years of Education: N/A    Occupational History  . Not on file.   Social History Main Topics  . Smoking status: Never Smoker   . Smokeless tobacco: Never Used  . Alcohol Use: No  . Drug Use: No  . Sexually Active: Not on file   Other Topics Concern  . Not on file   Social History Narrative  . No narrative on file    ROS: Requires corrective lenses-wears contacts; excellent and stable appetite; initial colonoscopy anticipated within the next month; stable weight.   All other systems reviewed and are negative.  PHYSICAL EXAM: BP 143/89  Pulse 106  Resp 16  Ht 5\' 4"  (1.626 m)  Wt 112.492 kg (248 lb)  BMI 42.57 kg/m2;  General-Well-developed; no acute distress Body Habitus-obese HEENT-[place]/AT; PERRL; EOM intact; conjunctiva and lids nl Neck-No JVD; no carotid bruits Endocrine-No thyromegaly Lungs-minimal basilar rales; resonant percussion; normal I-to-E ratio Cardiovascular- normal PMI; normal S1 and S2 Abdomen-BS normal; soft and non-tender without masses or organomegaly Musculoskeletal-No deformities, cyanosis or clubbing Neurologic-Nl cranial nerves; symmetric strength and tone Skin- Warm, no significant lesions Extremities-Nl distal pulses; no edema  EKG:  Tracing obtained 12/27/11 reviewed.  Normal sinus rhythm, left atrial abnormality, delayed R-wave progression, minimal nonspecific ST segment abnormality.  No previous tracing for comparison.   ASSESSMENT AND PLAN:  Jacqulyn Ducking, MD 01/10/2012 3:06 PM

## 2012-01-10 NOTE — Patient Instructions (Signed)
Your physician recommends that you schedule a follow-up appointment in: 3 weeks  Your physician has requested that you have an echocardiogram. Echocardiography is a painless test that uses sound waves to create images of your heart. It provides your doctor with information about the size and shape of your heart and how well your heart's chambers and valves are working. This procedure takes approximately one hour. There are no restrictions for this procedure.  Your physician has recommended you make the following change in your medication:  1 - Labetalol 200 mg twice daily 2 - Clonidine 0.1 mg twice daily

## 2012-01-10 NOTE — Assessment & Plan Note (Signed)
Blood pressure control has apparently been a long-standing issue.  There has been substantial improvement since her last office visit with Dr. Moshe Cipro; however, she is experiencing sedation with clonidine, even given once a day prior to retiring for the night.  We investigated obtaining transdermal clonidine for her, but the cost was prohibitive.  We will try a lower dose of the drug in divided doses, but I suspect that she will be unable to tolerate it.  Labetalol will also be added as an initial low dose of 200 mg twice a day.  Patient will monitor blood pressure at home and maintain the last values measured.

## 2012-01-13 ENCOUNTER — Telehealth: Payer: Self-pay | Admitting: Family Medicine

## 2012-01-13 ENCOUNTER — Other Ambulatory Visit: Payer: Self-pay

## 2012-01-13 MED ORDER — TRIAMTERENE-HCTZ 75-50 MG PO TABS: 1.0000 | ORAL_TABLET | Freq: Every day | ORAL | Status: DC

## 2012-01-13 NOTE — Telephone Encounter (Signed)
Med sent.

## 2012-01-17 ENCOUNTER — Ambulatory Visit (HOSPITAL_COMMUNITY): Payer: BC Managed Care – PPO

## 2012-01-19 ENCOUNTER — Telehealth: Payer: Self-pay | Admitting: *Deleted

## 2012-01-20 ENCOUNTER — Ambulatory Visit (HOSPITAL_COMMUNITY)
Admission: RE | Admit: 2012-01-20 | Discharge: 2012-01-20 | Disposition: A | Discharge status: Home / Self Care | Payer: BC Managed Care – PPO | Source: Ambulatory Visit | Attending: Cardiology | Admitting: Cardiology

## 2012-01-20 DIAGNOSIS — I059 Rheumatic mitral valve disease, unspecified: Secondary | ICD-10-CM

## 2012-01-20 DIAGNOSIS — I10 Essential (primary) hypertension: Secondary | ICD-10-CM | POA: Insufficient documentation

## 2012-01-20 DIAGNOSIS — E785 Hyperlipidemia, unspecified: Secondary | ICD-10-CM | POA: Insufficient documentation

## 2012-01-20 DIAGNOSIS — R9431 Abnormal electrocardiogram [ECG] [EKG]: Secondary | ICD-10-CM | POA: Insufficient documentation

## 2012-01-20 NOTE — Progress Notes (Signed)
*  PRELIMINARY RESULTS* Echocardiogram 2D Echocardiogram has been performed.  Tera Partridge 01/20/2012, 2:51 PM

## 2012-01-23 ENCOUNTER — Encounter: Payer: Self-pay | Admitting: *Deleted

## 2012-01-31 ENCOUNTER — Ambulatory Visit (INDEPENDENT_AMBULATORY_CARE_PROVIDER_SITE_OTHER): Payer: BC Managed Care – PPO | Admitting: Family Medicine

## 2012-01-31 ENCOUNTER — Encounter: Payer: Self-pay | Admitting: Family Medicine

## 2012-01-31 VITALS — BP 120/84 | HR 102 | Resp 16 | Ht 64.0 in | Wt 244.0 lb

## 2012-01-31 DIAGNOSIS — E669 Obesity, unspecified: Secondary | ICD-10-CM

## 2012-01-31 DIAGNOSIS — E785 Hyperlipidemia, unspecified: Secondary | ICD-10-CM

## 2012-01-31 DIAGNOSIS — R7303 Prediabetes: Secondary | ICD-10-CM

## 2012-01-31 DIAGNOSIS — I10 Essential (primary) hypertension: Secondary | ICD-10-CM

## 2012-01-31 DIAGNOSIS — N289 Disorder of kidney and ureter, unspecified: Secondary | ICD-10-CM

## 2012-01-31 DIAGNOSIS — R7309 Other abnormal glucose: Secondary | ICD-10-CM

## 2012-01-31 NOTE — Patient Instructions (Signed)
Continue your current medications Continue to work on the diet and exercise You do have borderline diabetes F/U in 3 months

## 2012-02-01 ENCOUNTER — Encounter: Payer: Self-pay | Admitting: Family Medicine

## 2012-02-01 DIAGNOSIS — N289 Disorder of kidney and ureter, unspecified: Secondary | ICD-10-CM | POA: Insufficient documentation

## 2012-02-01 HISTORY — DX: Disorder of kidney and ureter, unspecified: N28.9

## 2012-02-01 NOTE — Progress Notes (Signed)
  Subjective:    Patient ID: Angel Davis, female    DOB: 1951/11/04, 61 y.o.   MRN: [id number]  HPI  HTN- pt seen  By cardiology started on labetolol twice a day, clonidine decreased secondary to somnolence, taking all medications as prescribed, BP was much improved in cardiology office  Pre-diabets- A1C 6.4%, last set of labs reviewed with pt  Hyperlipidemia- lipid panel reviewed, much improved, with her weight loss  Congestion- nasal congestion, sinus pressure for 2 days, no fever, no cough,no SOB, started as scratchy throat   Review of Systems   GEN- denies fatigue, fever, weight loss,weakness, recent illness HEENT- denies eye drainage, change in vision, +nasal discharge, CVS- denies chest pain, palpitations RESP- denies SOB, cough, wheeze ABD- denies N/V, change in stools, abd pain Neuro- denies headache, dizziness, syncope, seizure activity       Objective:   Physical Exam GEN- NAD, alert and oriented x3 HEENT- PERRL, EOMI, non injected sclera, pink conjunctiva, MMM, oropharynx clear, TM clear bilat, +maxillary sinus pressure Neck- Supple, no LAD CVS- RRR, no murmur RESP-CTAB EXT- No edema Pulses- Radial, DP- 2+        Assessment & Plan:

## 2012-02-01 NOTE — Assessment & Plan Note (Signed)
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

## 2012-02-01 NOTE — Assessment & Plan Note (Signed)
Much improved, will continue current medications

## 2012-02-01 NOTE — Assessment & Plan Note (Signed)
No meds at this time, pt has lost significant weight, will continue to monitor

## 2012-02-01 NOTE — Assessment & Plan Note (Signed)
FLP continues to improve with weight loss, no medication needed at this time

## 2012-02-01 NOTE — Assessment & Plan Note (Signed)
Continue to encourage weight loss. 

## 2012-02-07 ENCOUNTER — Ambulatory Visit: Payer: BC Managed Care – PPO | Admitting: Cardiology

## 2012-02-16 ENCOUNTER — Encounter: Payer: Self-pay | Admitting: Family Medicine

## 2012-02-28 ENCOUNTER — Ambulatory Visit: Payer: BC Managed Care – PPO | Admitting: Cardiology

## 2012-06-27 ENCOUNTER — Ambulatory Visit: Payer: BC Managed Care – PPO | Admitting: Family Medicine

## 2012-07-05 ENCOUNTER — Other Ambulatory Visit: Payer: Self-pay | Admitting: Family Medicine

## 2012-07-24 ENCOUNTER — Ambulatory Visit (INDEPENDENT_AMBULATORY_CARE_PROVIDER_SITE_OTHER): Payer: BC Managed Care – PPO | Admitting: Family Medicine

## 2012-07-24 VITALS — BP 160/82 | HR 104 | Resp 18 | Ht 64.0 in | Wt 236.1 lb

## 2012-07-24 DIAGNOSIS — E785 Hyperlipidemia, unspecified: Secondary | ICD-10-CM

## 2012-07-24 DIAGNOSIS — I10 Essential (primary) hypertension: Secondary | ICD-10-CM

## 2012-07-24 DIAGNOSIS — E669 Obesity, unspecified: Secondary | ICD-10-CM

## 2012-07-24 DIAGNOSIS — R7303 Prediabetes: Secondary | ICD-10-CM

## 2012-07-24 DIAGNOSIS — R7309 Other abnormal glucose: Secondary | ICD-10-CM

## 2012-07-24 MED ORDER — TRIAMTERENE-HCTZ 75-50 MG PO TABS: 1.0000 | ORAL_TABLET | Freq: Every day | ORAL | Status: DC

## 2012-07-24 MED ORDER — CLONIDINE HCL 0.1 MG PO TABS: 0.1000 mg | ORAL_TABLET | Freq: Two times a day (BID) | ORAL | Status: DC

## 2012-07-24 NOTE — Patient Instructions (Addendum)
CPE in end December.  PLease have HBA1C today.  Please stop in/call and make appointment for colonoscopy thar was requested since earlier this year, this is a very important cancer screeening test.  Congrats on weight loss, please keep it up!  Blood pressure is high, since you are not taking labetalol, it has been removed from your med list. I have increased the clonidine to one in the morning and two at bedtime  Non fasting hBA1C and chem 7 in December 3 days before visit  You need to start calcium with vit D 1200mg  /1000IU once daily, we will show you a sample bottle, this is over the counter

## 2012-07-29 ENCOUNTER — Encounter: Payer: Self-pay | Admitting: Family Medicine

## 2012-07-29 NOTE — Assessment & Plan Note (Signed)
Improved, on no medication, managed by dietary change

## 2012-07-29 NOTE — Assessment & Plan Note (Signed)
Improved. Pt applauded on succesful weight loss through lifestyle change, and encouraged to continue same. Weight loss goal set for the next several months.  

## 2012-07-29 NOTE — Assessment & Plan Note (Signed)
Patient educated about the importance of limiting  Carbohydrate intake , the need to commit to daily physical activity for a minimum of 30 minutes , and to commit weight loss. The fact that changes in all these areas will reduce or eliminate all together the development of diabetes is stressed.   Updated lab needed 

## 2012-07-29 NOTE — Assessment & Plan Note (Signed)
Uncontrolled, med adjustment made. DASH diet and commitment to daily physical activity for a minimum of 30 minutes discussed and encouraged, as a part of hypertension management. The importance of attaining a healthy weight is also discussed.

## 2012-07-29 NOTE — Progress Notes (Signed)
  Subjective:    Patient ID: Angel Davis, female    DOB: 1951-03-15, 61 y.o.   MRN: [id number]  HPI The PT is here for follow up and re-evaluation of chronic medical conditions, medication management and review of any available recent lab and radiology data.  Preventive health is updated, specifically  Cancer screening and Immunization.   Questions or concerns regarding consultations or procedures which the PT has had in the interim are  Addressed.Was evaluated by cardiology, never did take labetalol prescribed, states she prefers to stick with clonidine, feels she will be better able to tolerate dose and will increase current The PT denies any adverse reactions to current medications since the last visit.  There are no new concerns. Has altered lifestyle positively with successful weight loss There are no specific complaints       Review of Systems See HPI Denies recent fever or chills. Denies sinus pressure, nasal congestion, ear pain or sore throat. Denies chest congestion, productive cough or wheezing. Denies chest pains, palpitations and leg swelling Denies abdominal pain, nausea, vomiting,diarrhea or constipation.   Denies dysuria, frequency, hesitancy or incontinence. Denies joint pain, swelling and limitation in mobility. Denies headaches, seizures, numbness, or tingling. Denies depression, anxiety or insomnia. Denies skin break down or rash.        Objective:   Physical Exam Patient alert and oriented and in no cardiopulmonary distress.  HEENT: No facial asymmetry, EOMI, no sinus tenderness,  oropharynx pink and moist.  Neck supple no adenopathy.  Chest: Clear to auscultation bilaterally.  CVS: S1, S2 no murmurs, no S3.  ABD: Soft non tender. Bowel sounds normal.  Ext: No edema  MS: Adequate ROM spine, shoulders, hips and knees.  Skin: Intact, no ulcerations or rash noted.  Psych: Good eye contact, normal affect. Memory intact not anxious or depressed  appearing.  CNS: CN 2-12 intact, power, tone and sensation normal throughout.        Assessment & Plan:

## 2012-09-27 ENCOUNTER — Other Ambulatory Visit: Payer: Self-pay | Admitting: Family Medicine

## 2012-10-31 ENCOUNTER — Other Ambulatory Visit: Payer: Self-pay | Admitting: Family Medicine

## 2012-11-22 ENCOUNTER — Other Ambulatory Visit: Payer: Self-pay | Admitting: Family Medicine

## 2013-01-08 ENCOUNTER — Other Ambulatory Visit: Payer: Self-pay | Admitting: Family Medicine

## 2013-02-21 ENCOUNTER — Other Ambulatory Visit: Payer: Self-pay | Admitting: Cardiology

## 2013-03-14 ENCOUNTER — Other Ambulatory Visit: Payer: Self-pay | Admitting: Family Medicine

## 2013-05-09 ENCOUNTER — Other Ambulatory Visit: Payer: Self-pay

## 2013-05-13 ENCOUNTER — Other Ambulatory Visit: Payer: Self-pay | Admitting: *Deleted

## 2013-05-13 DIAGNOSIS — I10 Essential (primary) hypertension: Secondary | ICD-10-CM

## 2013-05-13 MED ORDER — CLONIDINE HCL 0.1 MG PO TABS: 0.1000 mg | ORAL_TABLET | Freq: Two times a day (BID) | ORAL | Status: DC

## 2013-05-14 ENCOUNTER — Other Ambulatory Visit: Payer: Self-pay

## 2013-05-14 ENCOUNTER — Other Ambulatory Visit: Payer: Self-pay | Admitting: *Deleted

## 2013-05-14 ENCOUNTER — Telehealth: Payer: Self-pay | Admitting: Family Medicine

## 2013-05-14 DIAGNOSIS — I10 Essential (primary) hypertension: Secondary | ICD-10-CM

## 2013-05-14 DIAGNOSIS — E669 Obesity, unspecified: Secondary | ICD-10-CM

## 2013-05-14 DIAGNOSIS — R7303 Prediabetes: Secondary | ICD-10-CM

## 2013-05-14 DIAGNOSIS — E559 Vitamin D deficiency, unspecified: Secondary | ICD-10-CM

## 2013-05-14 DIAGNOSIS — E785 Hyperlipidemia, unspecified: Secondary | ICD-10-CM

## 2013-05-14 MED ORDER — TRIAMTERENE-HCTZ 75-50 MG PO TABS: 1.0000 | ORAL_TABLET | Freq: Every day | ORAL | Status: DC

## 2013-05-14 MED ORDER — PAROXETINE HCL 20 MG PO TABS: ORAL_TABLET | ORAL | Status: DC

## 2013-05-14 NOTE — Telephone Encounter (Signed)
Spoke with patient.  Refilled paxil and maxzide.   Labs reordered to be completed prior to visit.  Patient aware that she should be fasting for blood work.

## 2013-05-27 ENCOUNTER — Other Ambulatory Visit: Payer: Self-pay | Admitting: Family Medicine

## 2013-05-27 LAB — BASIC METABOLIC PANEL
BUN: 23 mg/dL (ref 6–23)
Chloride: 103 mEq/L (ref 96–112)
Creat: 1.56 mg/dL — ABNORMAL HIGH (ref 0.50–1.10)
Glucose, Bld: 92 mg/dL (ref 70–99)
Potassium: 4 mEq/L (ref 3.5–5.3)

## 2013-05-27 LAB — LIPID PANEL
Cholesterol: 196 mg/dL (ref 0–200)
Triglycerides: 79 mg/dL (ref <150)
VLDL: 16 mg/dL (ref 0–40)

## 2013-05-27 LAB — HEMOGLOBIN A1C: Mean Plasma Glucose: 131 mg/dL — ABNORMAL HIGH (ref <117)

## 2013-05-27 LAB — TSH: TSH: 2.095 u[IU]/mL (ref 0.350–4.500)

## 2013-05-28 LAB — CBC
HCT: 33.4 % — ABNORMAL LOW (ref 36.0–46.0)
Hemoglobin: 10.7 g/dL — ABNORMAL LOW (ref 12.0–15.0)
MCH: 24.8 pg — ABNORMAL LOW (ref 26.0–34.0)
MCHC: 32 g/dL (ref 30.0–36.0)
MCV: 77.5 fL — ABNORMAL LOW (ref 78.0–100.0)
Platelets: 343 10*3/uL (ref 150–400)
RBC: 4.31 MIL/uL (ref 3.87–5.11)
RDW: 15.8 % — ABNORMAL HIGH (ref 11.5–15.5)
WBC: 4.1 10*3/uL (ref 4.0–10.5)

## 2013-05-29 ENCOUNTER — Ambulatory Visit (INDEPENDENT_AMBULATORY_CARE_PROVIDER_SITE_OTHER): Payer: BC Managed Care – PPO | Admitting: Family Medicine

## 2013-05-29 ENCOUNTER — Encounter: Payer: Self-pay | Admitting: Family Medicine

## 2013-05-29 VITALS — BP 140/90 | HR 100 | Resp 16 | Ht 64.0 in | Wt 238.0 lb

## 2013-05-29 DIAGNOSIS — E559 Vitamin D deficiency, unspecified: Secondary | ICD-10-CM

## 2013-05-29 DIAGNOSIS — E669 Obesity, unspecified: Secondary | ICD-10-CM

## 2013-05-29 DIAGNOSIS — E785 Hyperlipidemia, unspecified: Secondary | ICD-10-CM

## 2013-05-29 DIAGNOSIS — E8881 Metabolic syndrome: Secondary | ICD-10-CM

## 2013-05-29 DIAGNOSIS — R7303 Prediabetes: Secondary | ICD-10-CM

## 2013-05-29 DIAGNOSIS — R7309 Other abnormal glucose: Secondary | ICD-10-CM

## 2013-05-29 DIAGNOSIS — I10 Essential (primary) hypertension: Secondary | ICD-10-CM

## 2013-05-29 DIAGNOSIS — Z1211 Encounter for screening for malignant neoplasm of colon: Secondary | ICD-10-CM

## 2013-05-29 DIAGNOSIS — F329 Major depressive disorder, single episode, unspecified: Secondary | ICD-10-CM

## 2013-05-29 MED ORDER — TRIAMTERENE-HCTZ 75-50 MG PO TABS: 1.0000 | ORAL_TABLET | Freq: Every day | ORAL | Status: DC

## 2013-05-29 MED ORDER — ERGOCALCIFEROL 1.25 MG (50000 UT) PO CAPS: 50000.0000 [IU] | ORAL_CAPSULE | ORAL | Status: DC

## 2013-05-29 MED ORDER — NIFEDIPINE ER OSMOTIC RELEASE 90 MG PO TB24: ORAL_TABLET | ORAL | Status: DC

## 2013-05-29 MED ORDER — PAROXETINE HCL 20 MG PO TABS: ORAL_TABLET | ORAL | Status: DC

## 2013-05-29 NOTE — Patient Instructions (Signed)
CPE in mid December, call if you need me before  You have been referred for colonoscopy AGAIN, call if no  Call/contact look for the letter, in the next 2 weeks so this can be followed through  Mammogram past due please call and schedule, you will get the number.  Congrats on 13 pound weight loss, goal is 12 pounds in next 6 month  Increase walking to 40 minutes daily   Cut back on cheese and fat, bad cholesterol is high, increasing risk of stroke and heart disease  Blood sugar is improved, still elevated, try to STOP eating or drinking sugar,   HBA1C and fasting chem 7 and lipid in 6 month, before vist, also vit D  You Need weekly vit D  Capsules for next 6 month, will reduce fall risk , yours is low   You should be on calcium with D every day 1200mg /100IU daily  You need aspirin 81 mg daily reduces stroke risk \ Mitvitamin daily recommended

## 2013-06-02 DIAGNOSIS — E8881 Metabolic syndrome: Secondary | ICD-10-CM | POA: Insufficient documentation

## 2013-06-02 HISTORY — DX: Metabolic syndrome: E88.810

## 2013-06-02 HISTORY — DX: Metabolic syndrome: E88.81

## 2013-06-02 NOTE — Progress Notes (Signed)
  Subjective:    Patient ID: Angel Davis, female    DOB: Sep 14, 1951, 62 y.o.   MRN: [id number]  HPI The PT is here for follow up and re-evaluation of chronic medical conditions, medication management and review of any available recent lab and radiology data.  Preventive health is updated, specifically  Cancer screening and Immunization. Still needs colonoscopy and pelvic exam  . The PT denies any adverse reactions to current medications since the last visit.  There are no new concerns. Working with success on healthier lifestyle , encouraged to continue same There are no specific complaints       Review of Systems See HPI Denies recent fever or chills. Denies sinus pressure, nasal congestion, ear pain or sore throat. Denies chest congestion, productive cough or wheezing. Denies chest pains, palpitations and leg swelling Denies abdominal pain, nausea, vomiting,diarrhea or constipation.   Denies dysuria, frequency, hesitancy or incontinence. Denies joint pain, swelling and limitation in mobility. Denies headaches, seizures, numbness, or tingling. Denies depression, anxiety or insomnia. Denies skin break down or rash.        Objective:   Physical Exam  Patient alert and oriented and in no cardiopulmonary distress.  HEENT: No facial asymmetry, EOMI, no sinus tenderness,  oropharynx pink and moist.  Neck supple no adenopathy.  Chest: Clear to auscultation bilaterally.  CVS: S1, S2 no murmurs, no S3.  ABD: Soft non tender. Bowel sounds normal.  Ext: No edema  MS: Adequate ROM spine, shoulders, hips and knees.  Skin: Intact, no ulcerations or rash noted.  Psych: Good eye contact, normal affect. Memory intact not anxious or depressed appearing.  CNS: CN 2-12 intact, power, tone and sensation normal throughout.       Assessment & Plan:

## 2013-06-02 NOTE — Assessment & Plan Note (Signed)
Elevated LDL Hyperlipidemia:Low fat diet discussed and encouraged.   

## 2013-06-02 NOTE — Assessment & Plan Note (Signed)
Improved. Pt applauded on succesful weight loss through lifestyle change, and encouraged to continue same. Weight loss goal set for the next several months.  

## 2013-06-02 NOTE — Assessment & Plan Note (Signed)
Improved Patient educated about the importance of limiting  Carbohydrate intake , the need to commit to daily physical activity for a minimum of 30 minutes , and to commit weight loss. The fact that changes in all these areas will reduce or eliminate all together the development of diabetes is stressed.

## 2013-06-02 NOTE — Assessment & Plan Note (Signed)
Pt understands the inc risk o CV disease associatred with this dx and will continue to work on lifestyle change to address this

## 2013-06-02 NOTE — Assessment & Plan Note (Signed)
Fair control though still sub optimal No med change DASH diet and commitment to daily physical activity for a minimum of 30 minutes discussed and encouraged, as a part of hypertension management. The importance of attaining a healthy weight is also discussed.

## 2013-06-02 NOTE — Assessment & Plan Note (Signed)
Controlled, no change in medication  

## 2013-06-06 ENCOUNTER — Encounter (INDEPENDENT_AMBULATORY_CARE_PROVIDER_SITE_OTHER): Payer: Self-pay | Admitting: *Deleted

## 2013-08-07 ENCOUNTER — Other Ambulatory Visit: Payer: Self-pay

## 2013-08-07 MED ORDER — TRIAMTERENE-HCTZ 75-50 MG PO TABS: 1.0000 | ORAL_TABLET | Freq: Every day | ORAL | Status: DC

## 2013-09-04 ENCOUNTER — Other Ambulatory Visit: Payer: Self-pay

## 2013-09-04 DIAGNOSIS — I10 Essential (primary) hypertension: Secondary | ICD-10-CM

## 2013-09-04 MED ORDER — CLONIDINE HCL 0.1 MG PO TABS: 0.1000 mg | ORAL_TABLET | Freq: Two times a day (BID) | ORAL | Status: DC

## 2013-09-26 ENCOUNTER — Other Ambulatory Visit: Payer: Self-pay

## 2013-11-07 ENCOUNTER — Encounter: Payer: BC Managed Care – PPO | Admitting: Family Medicine

## 2013-12-02 ENCOUNTER — Telehealth: Payer: Self-pay

## 2013-12-02 DIAGNOSIS — E785 Hyperlipidemia, unspecified: Secondary | ICD-10-CM

## 2013-12-02 DIAGNOSIS — I10 Essential (primary) hypertension: Secondary | ICD-10-CM

## 2013-12-02 DIAGNOSIS — R7303 Prediabetes: Secondary | ICD-10-CM

## 2013-12-02 NOTE — Telephone Encounter (Signed)
Will order labs to be done before next visit. Patient is aware and appt scheduled for 1/29 at 9:45am

## 2013-12-19 ENCOUNTER — Ambulatory Visit: Payer: BC Managed Care – PPO | Admitting: Family Medicine

## 2014-01-20 ENCOUNTER — Other Ambulatory Visit: Payer: Self-pay | Admitting: Cardiology

## 2014-01-20 ENCOUNTER — Other Ambulatory Visit: Payer: Self-pay | Admitting: Family Medicine

## 2014-01-20 DIAGNOSIS — I10 Essential (primary) hypertension: Secondary | ICD-10-CM

## 2014-01-20 DIAGNOSIS — Z1231 Encounter for screening mammogram for malignant neoplasm of breast: Secondary | ICD-10-CM

## 2014-01-20 MED ORDER — CLONIDINE HCL 0.1 MG PO TABS: 0.1000 mg | ORAL_TABLET | Freq: Two times a day (BID) | ORAL | Status: DC

## 2014-01-20 NOTE — Telephone Encounter (Signed)
rx refilled.

## 2014-01-21 ENCOUNTER — Ambulatory Visit: Payer: BC Managed Care – PPO | Admitting: Family Medicine

## 2014-01-23 ENCOUNTER — Ambulatory Visit (HOSPITAL_COMMUNITY): Payer: BC Managed Care – PPO

## 2014-01-28 ENCOUNTER — Ambulatory Visit (HOSPITAL_COMMUNITY)
Admission: RE | Admit: 2014-01-28 | Discharge: 2014-01-28 | Disposition: A | Discharge status: Home / Self Care | Payer: BC Managed Care – PPO | Source: Ambulatory Visit | Attending: Family Medicine | Admitting: Family Medicine

## 2014-01-28 DIAGNOSIS — Z1231 Encounter for screening mammogram for malignant neoplasm of breast: Secondary | ICD-10-CM | POA: Insufficient documentation

## 2014-03-19 ENCOUNTER — Other Ambulatory Visit: Payer: Self-pay | Admitting: Family Medicine

## 2014-04-21 LAB — COMPLETE METABOLIC PANEL WITH GFR
ALK PHOS: 72 U/L (ref 39–117)
ALT: 11 U/L (ref 0–35)
AST: 15 U/L (ref 0–37)
Albumin: 3.9 g/dL (ref 3.5–5.2)
BILIRUBIN TOTAL: 0.3 mg/dL (ref 0.2–1.2)
BUN: 16 mg/dL (ref 6–23)
CO2: 23 mEq/L (ref 19–32)
CREATININE: 1.06 mg/dL (ref 0.50–1.10)
Calcium: 9.4 mg/dL (ref 8.4–10.5)
Chloride: 104 mEq/L (ref 96–112)
GFR, EST NON AFRICAN AMERICAN: 56 mL/min — AB
GFR, Est African American: 65 mL/min
GLUCOSE: 88 mg/dL (ref 70–99)
Potassium: 4 mEq/L (ref 3.5–5.3)
Sodium: 138 mEq/L (ref 135–145)
Total Protein: 7.8 g/dL (ref 6.0–8.3)

## 2014-04-21 LAB — LIPID PANEL
Cholesterol: 215 mg/dL — ABNORMAL HIGH (ref 0–200)
HDL: 52 mg/dL (ref >39)
LDL CALC: 142 mg/dL — AB (ref 0–99)
TRIGLYCERIDES: 104 mg/dL (ref <150)
Total CHOL/HDL Ratio: 4.1 Ratio
VLDL: 21 mg/dL (ref 0–40)

## 2014-04-21 LAB — HEMOGLOBIN A1C
HEMOGLOBIN A1C: 6.4 % — AB (ref <5.7)
Mean Plasma Glucose: 137 mg/dL — ABNORMAL HIGH (ref <117)

## 2014-04-28 ENCOUNTER — Encounter: Payer: Self-pay | Admitting: Family Medicine

## 2014-04-28 ENCOUNTER — Ambulatory Visit (INDEPENDENT_AMBULATORY_CARE_PROVIDER_SITE_OTHER): Payer: BC Managed Care – PPO | Admitting: Family Medicine

## 2014-04-28 VITALS — BP 160/100 | HR 114 | Resp 16 | Wt 239.8 lb

## 2014-04-28 DIAGNOSIS — R7309 Other abnormal glucose: Secondary | ICD-10-CM

## 2014-04-28 DIAGNOSIS — I10 Essential (primary) hypertension: Secondary | ICD-10-CM

## 2014-04-28 DIAGNOSIS — M10072 Idiopathic gout, left ankle and foot: Secondary | ICD-10-CM

## 2014-04-28 DIAGNOSIS — M109 Gout, unspecified: Secondary | ICD-10-CM

## 2014-04-28 DIAGNOSIS — E8881 Metabolic syndrome: Secondary | ICD-10-CM

## 2014-04-28 DIAGNOSIS — R000 Tachycardia, unspecified: Secondary | ICD-10-CM

## 2014-04-28 DIAGNOSIS — M1000 Idiopathic gout, unspecified site: Secondary | ICD-10-CM

## 2014-04-28 DIAGNOSIS — E669 Obesity, unspecified: Secondary | ICD-10-CM

## 2014-04-28 DIAGNOSIS — F329 Major depressive disorder, single episode, unspecified: Secondary | ICD-10-CM

## 2014-04-28 DIAGNOSIS — E785 Hyperlipidemia, unspecified: Secondary | ICD-10-CM

## 2014-04-28 DIAGNOSIS — F3289 Other specified depressive episodes: Secondary | ICD-10-CM

## 2014-04-28 DIAGNOSIS — R7303 Prediabetes: Secondary | ICD-10-CM

## 2014-04-28 HISTORY — DX: Gout, unspecified: M10.9

## 2014-04-28 HISTORY — DX: Tachycardia, unspecified: R00.0

## 2014-04-28 MED ORDER — CLONIDINE HCL 0.2 MG PO TABS: 0.1000 mg | ORAL_TABLET | Freq: Two times a day (BID) | ORAL | Status: DC

## 2014-04-28 MED ORDER — TRIAMTERENE-HCTZ 75-50 MG PO TABS: 1.0000 | ORAL_TABLET | Freq: Every day | ORAL | Status: DC

## 2014-04-28 MED ORDER — PREDNISONE (PAK) 5 MG PO TABS: 5.0000 mg | ORAL_TABLET | ORAL | Status: DC

## 2014-04-28 MED ORDER — PAROXETINE HCL 20 MG PO TABS: ORAL_TABLET | ORAL | Status: DC

## 2014-04-28 MED ORDER — INDOMETHACIN 25 MG PO CAPS: ORAL_CAPSULE | ORAL | Status: DC

## 2014-04-28 MED ORDER — NIFEDIPINE ER OSMOTIC RELEASE 90 MG PO TB24: ORAL_TABLET | ORAL | Status: DC

## 2014-04-28 NOTE — Patient Instructions (Addendum)
F/u first week in August, call if you need me before  BP is still too hIGH, it is very important to get this correct  Increase in dose of clonidine, start today, 0.2mg  twice daily OK to take TWO 0.1mg  tabs twice daily till done   New for gout flare is prednisone for 5 days and indomethacin for 1 week  Blood sugar and cholesterol have increased, pls make changes in diet and exercise as we discussed to reduce your risk of heart disease  Pls schedule your colonoscopy

## 2014-05-15 ENCOUNTER — Encounter: Payer: Self-pay | Admitting: Family Medicine

## 2014-06-29 DIAGNOSIS — M109 Gout, unspecified: Secondary | ICD-10-CM | POA: Insufficient documentation

## 2014-06-29 HISTORY — DX: Gout, unspecified: M10.9

## 2014-06-29 NOTE — Assessment & Plan Note (Signed)
Uncontrolled, increase dose of BP medication DASH diet and commitment to daily physical activity for a minimum of 30 minutes discussed and encouraged, as a part of hypertension management. The importance of attaining a healthy weight is also discussed.

## 2014-06-29 NOTE — Assessment & Plan Note (Signed)
Acute flare of pain and swelling, will treat with short course of prednisone and indomethacin, needs uric acid level checked to see if she needs allopurinol, may alos need top change maxzide

## 2014-06-29 NOTE — Assessment & Plan Note (Signed)
Office EKG to determine ryhtm and evaluate for any significant abnormality. EKG shows sinus rythm HR less than 100 with non specific St changes, nothing acute

## 2014-06-29 NOTE — Assessment & Plan Note (Signed)
Controlled, no change in medication  

## 2014-06-29 NOTE — Assessment & Plan Note (Signed)
Unchanged. Patient re-educated about  the importance of commitment to a  minimum of 150 minutes of exercise per week. The importance of healthy food choices with portion control discussed. Encouraged to start a food diary, count calories and to consider  joining a support group. Sample diet sheets offered. Goals set by the patient for the next several months.    

## 2014-06-29 NOTE — Assessment & Plan Note (Signed)
Deteriortaed. Patient educated about the importance of limiting  Carbohydrate intake , the need to commit to daily physical activity for a minimum of 30 minutes , and to commit weight loss. The fact that changes in all these areas will reduce or eliminate all together the development of diabetes is stressed.   rept in 4 month

## 2014-06-29 NOTE — Assessment & Plan Note (Signed)
The increased risk of cardiovascular disease associated with this diagnosis, and the need to consistently work on lifestyle to change this is discussed. Following  a  heart healthy diet ,commitment to 30 minutes of exercise at least 5 days per week, as well as control of blood sugar and cholesterol , and achieving a healthy weight are all the areas to be addressed .  

## 2014-06-29 NOTE — Assessment & Plan Note (Signed)
Deteriorated. Hyperlipidemia:Low fat diet discussed and encouraged.  Offered statin , pt wants "to do it on my own" Fact that she has inc CV risk factors also discussed

## 2014-06-29 NOTE — Progress Notes (Signed)
Subjective:    Patient ID: Angel Davis, female    DOB: 05-17-1951, 63 y.o.   MRN: [id number]  HPI 1 week h/o acute pain and swelling of left foot, no h/o trauma, has lessened somewhat with the use of OTC pain medication. Chronic health issues are also addressed. Unfortunately pt consistently does not keep f/u appointments nor does she follow through on necessary tests, no reason except that she "doesn't like doctors" Re education attempted once more. Blood pressure remains uncontrolled, and parameters as far as lipids and blood sugar have worsened   Review of Systems See HPI Denies recent fever or chills. Denies sinus pressure, nasal congestion, ear pain or sore throat. Denies chest congestion, productive cough or wheezing. Denies chest pains, palpitations and leg swelling Denies abdominal pain, nausea, vomiting,diarrhea or constipation.   Denies dysuria, frequency, hesitancy or incontinence.  Denies headaches, seizures, numbness, or tingling. Denies uncontrolled depression, anxiety or insomnia. Denies skin break down or rash.        Objective:   Physical Exam BP 160/100  Pulse 114  Resp 16  Wt 239 lb 12.8 oz (108.773 kg)  SpO2 96% Patient alert and oriented and in no cardiopulmonary distress.  HEENT: No facial asymmetry, EOMI,   oropharynx pink and moist.  Neck supple no JVD, no mass.  Chest: Clear to auscultation bilaterally.  CVS: S1, S2 no murmurs, no S3.Regular rate.  ABD: Soft non tender.   Ext: No edema  MS: Adequate ROM spine, shoulders, hips and knees.Left great toe swollen , red and tender extends int dorsum of foot  Skin: Intact, no ulcerations or rash noted.  Psych: Good eye contact, normal affect. Memory intact not anxious or depressed appearing.  CNS: CN 2-12 intact, power,  normal throughout.no focal deficits noted.        Assessment & Plan:  HYPERTENSION Uncontrolled, increase dose of BP medication DASH diet and commitment to daily  physical activity for a minimum of 30 minutes discussed and encouraged, as a part of hypertension management. The importance of attaining a healthy weight is also discussed.   Prediabetes Deteriortaed. Patient educated about the importance of limiting  Carbohydrate intake , the need to commit to daily physical activity for a minimum of 30 minutes , and to commit weight loss. The fact that changes in all these areas will reduce or eliminate all together the development of diabetes is stressed.   rept in 4 month  Hyperlipidemia Deteriorated. Hyperlipidemia:Low fat diet discussed and encouraged.  Offered statin , pt wants "to do it on my own" Fact that she has inc CV risk factors also discussed  Metabolic syndrome X The increased risk of cardiovascular disease associated with this diagnosis, and the need to consistently work on lifestyle to change this is discussed. Following  a  heart healthy diet ,commitment to 30 minutes of exercise at least 5 days per week, as well as control of blood sugar and cholesterol , and achieving a healthy weight are all the areas to be addressed .   OBESITY Unchanged Patient re-educated about  the importance of commitment to a  minimum of 150 minutes of exercise per week. The importance of healthy food choices with portion control discussed. Encouraged to start a food diary, count calories and to consider  joining a support group. Sample diet sheets offered. Goals set by the patient for the next several months.     Acute gout Acute flare of pain and swelling, will treat with short course of  prednisone and indomethacin, needs uric acid level checked to see if she needs allopurinol, may alos need top change maxzide  Tachycardia Office EKG to determine ryhtm and evaluate for any significant abnormality. EKG shows sinus rythm HR less than 100 with non specific St changes, nothing acute  DEPRESSION Controlled, no change in medication

## 2014-07-08 ENCOUNTER — Ambulatory Visit: Payer: BC Managed Care – PPO | Admitting: Family Medicine

## 2015-01-26 ENCOUNTER — Other Ambulatory Visit: Payer: Self-pay | Admitting: Family Medicine

## 2015-01-26 ENCOUNTER — Telehealth: Payer: Self-pay | Admitting: Family Medicine

## 2015-01-26 DIAGNOSIS — Z1231 Encounter for screening mammogram for malignant neoplasm of breast: Secondary | ICD-10-CM

## 2015-01-26 MED ORDER — TRIAMTERENE-HCTZ 75-50 MG PO TABS: 1.0000 | ORAL_TABLET | Freq: Every day | ORAL | Status: DC

## 2015-01-26 MED ORDER — PAROXETINE HCL 20 MG PO TABS: ORAL_TABLET | ORAL | Status: DC

## 2015-01-27 ENCOUNTER — Other Ambulatory Visit: Payer: Self-pay | Admitting: Family Medicine

## 2015-01-27 NOTE — Telephone Encounter (Signed)
Medicine was sent in by [facility]

## 2015-02-05 ENCOUNTER — Ambulatory Visit (HOSPITAL_COMMUNITY): Payer: BC Managed Care – PPO

## 2015-02-11 ENCOUNTER — Ambulatory Visit (HOSPITAL_COMMUNITY): Payer: BC Managed Care – PPO

## 2015-03-02 ENCOUNTER — Ambulatory Visit: Payer: BC Managed Care – PPO | Admitting: Family Medicine

## 2015-08-07 ENCOUNTER — Other Ambulatory Visit: Payer: Self-pay | Admitting: Family Medicine

## 2015-08-12 ENCOUNTER — Other Ambulatory Visit: Payer: Self-pay

## 2015-08-12 MED ORDER — NIFEDIPINE ER OSMOTIC RELEASE 90 MG PO TB24: 90.0000 mg | ORAL_TABLET | Freq: Every day | ORAL | Status: AC

## 2017-05-30 ENCOUNTER — Other Ambulatory Visit (HOSPITAL_COMMUNITY): Payer: Self-pay | Admitting: Family Medicine

## 2017-05-30 DIAGNOSIS — Z1231 Encounter for screening mammogram for malignant neoplasm of breast: Secondary | ICD-10-CM

## 2018-07-18 DIAGNOSIS — Z0001 Encounter for general adult medical examination with abnormal findings: Secondary | ICD-10-CM | POA: Diagnosis not present

## 2018-07-18 DIAGNOSIS — Z6841 Body Mass Index (BMI) 40.0 and over, adult: Secondary | ICD-10-CM | POA: Diagnosis not present

## 2018-07-18 DIAGNOSIS — E782 Mixed hyperlipidemia: Secondary | ICD-10-CM | POA: Diagnosis not present

## 2018-07-18 DIAGNOSIS — Z0000 Encounter for general adult medical examination without abnormal findings: Secondary | ICD-10-CM | POA: Diagnosis not present

## 2018-07-18 DIAGNOSIS — N183 Chronic kidney disease, stage 3 (moderate): Secondary | ICD-10-CM | POA: Diagnosis not present

## 2018-07-18 DIAGNOSIS — I10 Essential (primary) hypertension: Secondary | ICD-10-CM | POA: Diagnosis not present

## 2018-07-18 DIAGNOSIS — R7309 Other abnormal glucose: Secondary | ICD-10-CM | POA: Diagnosis not present

## 2018-07-18 DIAGNOSIS — E119 Type 2 diabetes mellitus without complications: Secondary | ICD-10-CM | POA: Diagnosis not present

## 2018-07-18 DIAGNOSIS — Z1389 Encounter for screening for other disorder: Secondary | ICD-10-CM | POA: Diagnosis not present

## 2018-07-26 ENCOUNTER — Other Ambulatory Visit (HOSPITAL_COMMUNITY): Payer: Self-pay | Admitting: Physician Assistant

## 2018-07-26 DIAGNOSIS — Z1231 Encounter for screening mammogram for malignant neoplasm of breast: Secondary | ICD-10-CM

## 2018-11-05 DIAGNOSIS — I519 Heart disease, unspecified: Secondary | ICD-10-CM | POA: Diagnosis not present

## 2018-11-05 DIAGNOSIS — J181 Lobar pneumonia, unspecified organism: Secondary | ICD-10-CM | POA: Diagnosis not present

## 2018-11-05 DIAGNOSIS — K1230 Oral mucositis (ulcerative), unspecified: Secondary | ICD-10-CM | POA: Diagnosis not present

## 2018-11-05 DIAGNOSIS — R4182 Altered mental status, unspecified: Secondary | ICD-10-CM | POA: Diagnosis not present

## 2018-11-05 DIAGNOSIS — D473 Essential (hemorrhagic) thrombocythemia: Secondary | ICD-10-CM | POA: Diagnosis not present

## 2018-11-05 DIAGNOSIS — I5022 Chronic systolic (congestive) heart failure: Secondary | ICD-10-CM | POA: Diagnosis not present

## 2018-11-05 DIAGNOSIS — J9601 Acute respiratory failure with hypoxia: Secondary | ICD-10-CM | POA: Diagnosis present

## 2018-11-05 DIAGNOSIS — J80 Acute respiratory distress syndrome: Secondary | ICD-10-CM | POA: Diagnosis not present

## 2018-11-05 DIAGNOSIS — I517 Cardiomegaly: Secondary | ICD-10-CM | POA: Diagnosis not present

## 2018-11-05 DIAGNOSIS — Z6841 Body Mass Index (BMI) 40.0 and over, adult: Secondary | ICD-10-CM | POA: Diagnosis not present

## 2018-11-05 DIAGNOSIS — A4189 Other specified sepsis: Secondary | ICD-10-CM | POA: Diagnosis present

## 2018-11-05 DIAGNOSIS — D508 Other iron deficiency anemias: Secondary | ICD-10-CM | POA: Diagnosis not present

## 2018-11-05 DIAGNOSIS — I255 Ischemic cardiomyopathy: Secondary | ICD-10-CM | POA: Diagnosis present

## 2018-11-05 DIAGNOSIS — I480 Paroxysmal atrial fibrillation: Secondary | ICD-10-CM | POA: Diagnosis not present

## 2018-11-05 DIAGNOSIS — H9192 Unspecified hearing loss, left ear: Secondary | ICD-10-CM | POA: Diagnosis present

## 2018-11-05 DIAGNOSIS — R29702 NIHSS score 2: Secondary | ICD-10-CM | POA: Diagnosis present

## 2018-11-05 DIAGNOSIS — J690 Pneumonitis due to inhalation of food and vomit: Secondary | ICD-10-CM | POA: Diagnosis not present

## 2018-11-05 DIAGNOSIS — E876 Hypokalemia: Secondary | ICD-10-CM | POA: Diagnosis not present

## 2018-11-05 DIAGNOSIS — R7881 Bacteremia: Secondary | ICD-10-CM | POA: Diagnosis not present

## 2018-11-05 DIAGNOSIS — M25561 Pain in right knee: Secondary | ICD-10-CM | POA: Diagnosis not present

## 2018-11-05 DIAGNOSIS — R279 Unspecified lack of coordination: Secondary | ICD-10-CM | POA: Diagnosis not present

## 2018-11-05 DIAGNOSIS — I6350 Cerebral infarction due to unspecified occlusion or stenosis of unspecified cerebral artery: Secondary | ICD-10-CM | POA: Diagnosis not present

## 2018-11-05 DIAGNOSIS — R809 Proteinuria, unspecified: Secondary | ICD-10-CM | POA: Diagnosis not present

## 2018-11-05 DIAGNOSIS — F419 Anxiety disorder, unspecified: Secondary | ICD-10-CM | POA: Diagnosis present

## 2018-11-05 DIAGNOSIS — J069 Acute upper respiratory infection, unspecified: Secondary | ICD-10-CM | POA: Diagnosis not present

## 2018-11-05 DIAGNOSIS — M25569 Pain in unspecified knee: Secondary | ICD-10-CM | POA: Diagnosis not present

## 2018-11-05 DIAGNOSIS — I5020 Unspecified systolic (congestive) heart failure: Secondary | ICD-10-CM | POA: Diagnosis present

## 2018-11-05 DIAGNOSIS — A419 Sepsis, unspecified organism: Secondary | ICD-10-CM | POA: Diagnosis not present

## 2018-11-05 DIAGNOSIS — R6521 Severe sepsis with septic shock: Secondary | ICD-10-CM | POA: Diagnosis not present

## 2018-11-05 DIAGNOSIS — Z9911 Dependence on respirator [ventilator] status: Secondary | ICD-10-CM | POA: Diagnosis not present

## 2018-11-05 DIAGNOSIS — J13 Pneumonia due to Streptococcus pneumoniae: Secondary | ICD-10-CM | POA: Diagnosis present

## 2018-11-05 DIAGNOSIS — R404 Transient alteration of awareness: Secondary | ICD-10-CM | POA: Diagnosis not present

## 2018-11-05 DIAGNOSIS — F418 Other specified anxiety disorders: Secondary | ICD-10-CM | POA: Diagnosis not present

## 2018-11-05 DIAGNOSIS — R402433 Glasgow coma scale score 3-8, at hospital admission: Secondary | ICD-10-CM | POA: Diagnosis present

## 2018-11-05 DIAGNOSIS — J154 Pneumonia due to other streptococci: Secondary | ICD-10-CM | POA: Diagnosis not present

## 2018-11-05 DIAGNOSIS — D696 Thrombocytopenia, unspecified: Secondary | ICD-10-CM | POA: Diagnosis not present

## 2018-11-05 DIAGNOSIS — I6389 Other cerebral infarction: Secondary | ICD-10-CM | POA: Diagnosis not present

## 2018-11-05 DIAGNOSIS — A403 Sepsis due to Streptococcus pneumoniae: Secondary | ICD-10-CM | POA: Diagnosis not present

## 2018-11-05 DIAGNOSIS — R05 Cough: Secondary | ICD-10-CM | POA: Diagnosis not present

## 2018-11-05 DIAGNOSIS — Z7902 Long term (current) use of antithrombotics/antiplatelets: Secondary | ICD-10-CM | POA: Diagnosis not present

## 2018-11-05 DIAGNOSIS — G4489 Other headache syndrome: Secondary | ICD-10-CM | POA: Diagnosis not present

## 2018-11-05 DIAGNOSIS — F329 Major depressive disorder, single episode, unspecified: Secondary | ICD-10-CM | POA: Diagnosis present

## 2018-11-05 DIAGNOSIS — I10 Essential (primary) hypertension: Secondary | ICD-10-CM | POA: Diagnosis present

## 2018-11-05 DIAGNOSIS — R6520 Severe sepsis without septic shock: Secondary | ICD-10-CM | POA: Diagnosis not present

## 2018-11-05 DIAGNOSIS — Z4682 Encounter for fitting and adjustment of non-vascular catheter: Secondary | ICD-10-CM | POA: Diagnosis not present

## 2018-11-05 DIAGNOSIS — Z9981 Dependence on supplemental oxygen: Secondary | ICD-10-CM | POA: Diagnosis not present

## 2018-11-05 DIAGNOSIS — E669 Obesity, unspecified: Secondary | ICD-10-CM | POA: Diagnosis present

## 2018-11-05 DIAGNOSIS — E8770 Fluid overload, unspecified: Secondary | ICD-10-CM | POA: Diagnosis not present

## 2018-11-05 DIAGNOSIS — E8809 Other disorders of plasma-protein metabolism, not elsewhere classified: Secondary | ICD-10-CM | POA: Diagnosis not present

## 2018-11-05 DIAGNOSIS — B974 Respiratory syncytial virus as the cause of diseases classified elsewhere: Secondary | ICD-10-CM | POA: Diagnosis not present

## 2018-11-05 DIAGNOSIS — Z7982 Long term (current) use of aspirin: Secondary | ICD-10-CM | POA: Diagnosis not present

## 2018-11-05 DIAGNOSIS — I214 Non-ST elevation (NSTEMI) myocardial infarction: Secondary | ICD-10-CM | POA: Diagnosis present

## 2018-11-05 DIAGNOSIS — H918X2 Other specified hearing loss, left ear: Secondary | ICD-10-CM | POA: Diagnosis not present

## 2018-11-05 DIAGNOSIS — G9389 Other specified disorders of brain: Secondary | ICD-10-CM | POA: Diagnosis not present

## 2018-11-05 DIAGNOSIS — J9600 Acute respiratory failure, unspecified whether with hypoxia or hypercapnia: Secondary | ICD-10-CM | POA: Diagnosis not present

## 2018-11-05 DIAGNOSIS — G8929 Other chronic pain: Secondary | ICD-10-CM | POA: Diagnosis not present

## 2018-11-05 DIAGNOSIS — J0110 Acute frontal sinusitis, unspecified: Secondary | ICD-10-CM | POA: Diagnosis present

## 2018-11-05 DIAGNOSIS — E872 Acidosis: Secondary | ICD-10-CM | POA: Diagnosis not present

## 2018-11-05 DIAGNOSIS — I639 Cerebral infarction, unspecified: Secondary | ICD-10-CM | POA: Diagnosis present

## 2018-11-05 DIAGNOSIS — I119 Hypertensive heart disease without heart failure: Secondary | ICD-10-CM | POA: Diagnosis not present

## 2018-11-05 DIAGNOSIS — G9340 Encephalopathy, unspecified: Secondary | ICD-10-CM | POA: Diagnosis not present

## 2018-11-05 DIAGNOSIS — I5021 Acute systolic (congestive) heart failure: Secondary | ICD-10-CM | POA: Diagnosis present

## 2018-11-05 DIAGNOSIS — N179 Acute kidney failure, unspecified: Secondary | ICD-10-CM | POA: Diagnosis not present

## 2018-11-05 DIAGNOSIS — N170 Acute kidney failure with tubular necrosis: Secondary | ICD-10-CM | POA: Diagnosis present

## 2018-11-05 DIAGNOSIS — Z736 Limitation of activities due to disability: Secondary | ICD-10-CM | POA: Diagnosis not present

## 2018-11-05 DIAGNOSIS — G9341 Metabolic encephalopathy: Secondary | ICD-10-CM | POA: Diagnosis present

## 2018-11-05 DIAGNOSIS — R0689 Other abnormalities of breathing: Secondary | ICD-10-CM | POA: Diagnosis not present

## 2018-11-05 DIAGNOSIS — H6692 Otitis media, unspecified, left ear: Secondary | ICD-10-CM | POA: Diagnosis not present

## 2018-11-05 DIAGNOSIS — R4020 Unspecified coma: Secondary | ICD-10-CM | POA: Diagnosis not present

## 2018-11-05 DIAGNOSIS — H9193 Unspecified hearing loss, bilateral: Secondary | ICD-10-CM | POA: Diagnosis not present

## 2018-11-05 DIAGNOSIS — N189 Chronic kidney disease, unspecified: Secondary | ICD-10-CM | POA: Diagnosis not present

## 2018-11-05 DIAGNOSIS — Z781 Physical restraint status: Secondary | ICD-10-CM | POA: Diagnosis not present

## 2018-11-05 DIAGNOSIS — R0902 Hypoxemia: Secondary | ICD-10-CM | POA: Diagnosis not present

## 2018-11-05 DIAGNOSIS — Z8673 Personal history of transient ischemic attack (TIA), and cerebral infarction without residual deficits: Secondary | ICD-10-CM | POA: Diagnosis not present

## 2018-11-05 DIAGNOSIS — B953 Streptococcus pneumoniae as the cause of diseases classified elsewhere: Secondary | ICD-10-CM | POA: Diagnosis not present

## 2018-11-05 DIAGNOSIS — J329 Chronic sinusitis, unspecified: Secondary | ICD-10-CM | POA: Diagnosis not present

## 2018-11-05 DIAGNOSIS — M6281 Muscle weakness (generalized): Secondary | ICD-10-CM | POA: Diagnosis not present

## 2018-11-05 DIAGNOSIS — J019 Acute sinusitis, unspecified: Secondary | ICD-10-CM | POA: Diagnosis not present

## 2018-11-05 DIAGNOSIS — J9691 Respiratory failure, unspecified with hypoxia: Secondary | ICD-10-CM | POA: Diagnosis not present

## 2018-11-05 DIAGNOSIS — R9431 Abnormal electrocardiogram [ECG] [EKG]: Secondary | ICD-10-CM | POA: Diagnosis not present

## 2018-11-05 DIAGNOSIS — R748 Abnormal levels of other serum enzymes: Secondary | ICD-10-CM | POA: Diagnosis not present

## 2018-11-05 DIAGNOSIS — R000 Tachycardia, unspecified: Secondary | ICD-10-CM | POA: Diagnosis not present

## 2018-11-05 DIAGNOSIS — J121 Respiratory syncytial virus pneumonia: Secondary | ICD-10-CM | POA: Diagnosis not present

## 2018-11-05 DIAGNOSIS — Z79899 Other long term (current) drug therapy: Secondary | ICD-10-CM | POA: Diagnosis not present

## 2018-11-05 DIAGNOSIS — J984 Other disorders of lung: Secondary | ICD-10-CM | POA: Diagnosis not present

## 2018-11-05 DIAGNOSIS — M25562 Pain in left knee: Secondary | ICD-10-CM | POA: Diagnosis not present

## 2018-11-05 DIAGNOSIS — D509 Iron deficiency anemia, unspecified: Secondary | ICD-10-CM | POA: Diagnosis not present

## 2018-11-05 DIAGNOSIS — I110 Hypertensive heart disease with heart failure: Secondary | ICD-10-CM | POA: Diagnosis not present

## 2018-11-05 DIAGNOSIS — R7989 Other specified abnormal findings of blood chemistry: Secondary | ICD-10-CM | POA: Diagnosis not present

## 2018-11-05 DIAGNOSIS — R262 Difficulty in walking, not elsewhere classified: Secondary | ICD-10-CM | POA: Diagnosis not present

## 2018-11-05 DIAGNOSIS — J189 Pneumonia, unspecified organism: Secondary | ICD-10-CM | POA: Diagnosis not present

## 2018-11-05 DIAGNOSIS — I429 Cardiomyopathy, unspecified: Secondary | ICD-10-CM | POA: Diagnosis not present

## 2018-11-05 DIAGNOSIS — H8302 Labyrinthitis, left ear: Secondary | ICD-10-CM | POA: Diagnosis present

## 2018-11-20 ENCOUNTER — Inpatient Hospital Stay
Admission: RE | Admit: 2018-11-20 | Discharge: 2018-12-07 | Disposition: A | Discharge status: Home / Self Care | Payer: BC Managed Care – PPO | Source: Ambulatory Visit | Attending: Internal Medicine | Admitting: Internal Medicine

## 2018-11-20 DIAGNOSIS — J329 Chronic sinusitis, unspecified: Secondary | ICD-10-CM | POA: Diagnosis not present

## 2018-11-20 DIAGNOSIS — J9601 Acute respiratory failure with hypoxia: Secondary | ICD-10-CM | POA: Diagnosis not present

## 2018-11-20 DIAGNOSIS — M25551 Pain in right hip: Secondary | ICD-10-CM | POA: Diagnosis not present

## 2018-11-20 DIAGNOSIS — J069 Acute upper respiratory infection, unspecified: Secondary | ICD-10-CM | POA: Diagnosis not present

## 2018-11-20 DIAGNOSIS — D509 Iron deficiency anemia, unspecified: Secondary | ICD-10-CM | POA: Diagnosis not present

## 2018-11-20 DIAGNOSIS — I10 Essential (primary) hypertension: Secondary | ICD-10-CM | POA: Diagnosis not present

## 2018-11-20 DIAGNOSIS — J9691 Respiratory failure, unspecified with hypoxia: Secondary | ICD-10-CM | POA: Diagnosis not present

## 2018-11-20 DIAGNOSIS — I639 Cerebral infarction, unspecified: Secondary | ICD-10-CM | POA: Diagnosis not present

## 2018-11-20 DIAGNOSIS — I214 Non-ST elevation (NSTEMI) myocardial infarction: Secondary | ICD-10-CM | POA: Diagnosis not present

## 2018-11-20 DIAGNOSIS — Z8673 Personal history of transient ischemic attack (TIA), and cerebral infarction without residual deficits: Secondary | ICD-10-CM | POA: Diagnosis not present

## 2018-11-20 DIAGNOSIS — M6281 Muscle weakness (generalized): Secondary | ICD-10-CM | POA: Diagnosis not present

## 2018-11-20 DIAGNOSIS — D473 Essential (hemorrhagic) thrombocythemia: Secondary | ICD-10-CM | POA: Diagnosis not present

## 2018-11-20 DIAGNOSIS — A403 Sepsis due to Streptococcus pneumoniae: Secondary | ICD-10-CM | POA: Diagnosis not present

## 2018-11-20 DIAGNOSIS — R262 Difficulty in walking, not elsewhere classified: Secondary | ICD-10-CM | POA: Diagnosis not present

## 2018-11-20 DIAGNOSIS — N179 Acute kidney failure, unspecified: Secondary | ICD-10-CM | POA: Diagnosis not present

## 2018-11-20 DIAGNOSIS — J13 Pneumonia due to Streptococcus pneumoniae: Secondary | ICD-10-CM | POA: Diagnosis not present

## 2018-11-20 DIAGNOSIS — N289 Disorder of kidney and ureter, unspecified: Secondary | ICD-10-CM | POA: Diagnosis not present

## 2018-11-20 DIAGNOSIS — I6389 Other cerebral infarction: Secondary | ICD-10-CM | POA: Diagnosis not present

## 2018-11-20 DIAGNOSIS — R279 Unspecified lack of coordination: Secondary | ICD-10-CM | POA: Diagnosis not present

## 2018-11-20 DIAGNOSIS — D508 Other iron deficiency anemias: Secondary | ICD-10-CM | POA: Diagnosis not present

## 2018-11-20 DIAGNOSIS — R7881 Bacteremia: Secondary | ICD-10-CM | POA: Diagnosis not present

## 2018-11-20 DIAGNOSIS — F329 Major depressive disorder, single episode, unspecified: Secondary | ICD-10-CM | POA: Diagnosis not present

## 2018-11-20 DIAGNOSIS — J0140 Acute pansinusitis, unspecified: Secondary | ICD-10-CM | POA: Diagnosis not present

## 2018-11-20 DIAGNOSIS — B974 Respiratory syncytial virus as the cause of diseases classified elsewhere: Secondary | ICD-10-CM | POA: Diagnosis not present

## 2018-11-22 ENCOUNTER — Non-Acute Institutional Stay (SKILLED_NURSING_FACILITY): Payer: Medicare Other | Admitting: Internal Medicine

## 2018-11-22 ENCOUNTER — Encounter (HOSPITAL_COMMUNITY)
Admission: RE | Admit: 2018-11-22 | Discharge: 2018-11-22 | Disposition: A | Discharge status: Home / Self Care | Payer: BC Managed Care – PPO | Source: Skilled Nursing Facility | Attending: Internal Medicine | Admitting: Internal Medicine

## 2018-11-22 ENCOUNTER — Encounter: Payer: Self-pay | Admitting: Internal Medicine

## 2018-11-22 ENCOUNTER — Encounter (HOSPITAL_COMMUNITY)
Admission: RE | Admit: 2018-11-22 | Payer: BC Managed Care – PPO | Source: Skilled Nursing Facility | Admitting: *Deleted

## 2018-11-22 DIAGNOSIS — D75839 Thrombocytosis, unspecified: Secondary | ICD-10-CM

## 2018-11-22 DIAGNOSIS — N179 Acute kidney failure, unspecified: Secondary | ICD-10-CM

## 2018-11-22 DIAGNOSIS — D473 Essential (hemorrhagic) thrombocythemia: Secondary | ICD-10-CM | POA: Diagnosis not present

## 2018-11-22 DIAGNOSIS — J9601 Acute respiratory failure with hypoxia: Secondary | ICD-10-CM

## 2018-11-22 DIAGNOSIS — I214 Non-ST elevation (NSTEMI) myocardial infarction: Secondary | ICD-10-CM | POA: Insufficient documentation

## 2018-11-22 DIAGNOSIS — I6389 Other cerebral infarction: Secondary | ICD-10-CM

## 2018-11-22 DIAGNOSIS — I10 Essential (primary) hypertension: Secondary | ICD-10-CM | POA: Diagnosis not present

## 2018-11-22 DIAGNOSIS — A403 Sepsis due to Streptococcus pneumoniae: Secondary | ICD-10-CM | POA: Insufficient documentation

## 2018-11-22 DIAGNOSIS — I639 Cerebral infarction, unspecified: Secondary | ICD-10-CM | POA: Insufficient documentation

## 2018-11-22 DIAGNOSIS — D509 Iron deficiency anemia, unspecified: Secondary | ICD-10-CM | POA: Diagnosis not present

## 2018-11-22 LAB — CBC WITH DIFFERENTIAL/PLATELET
Abs Immature Granulocytes: 0.02 10*3/uL (ref 0.00–0.07)
Basophils Absolute: 0.1 10*3/uL (ref 0.0–0.1)
Basophils Relative: 1 %
Eosinophils Absolute: 0.5 10*3/uL (ref 0.0–0.5)
Eosinophils Relative: 7 %
HCT: 27.8 % — ABNORMAL LOW (ref 36.0–46.0)
Hemoglobin: 8 g/dL — ABNORMAL LOW (ref 12.0–15.0)
Immature Granulocytes: 0 %
Lymphocytes Relative: 14 %
Lymphs Abs: 1.1 10*3/uL (ref 0.7–4.0)
MCH: 24.7 pg — ABNORMAL LOW (ref 26.0–34.0)
MCHC: 28.8 g/dL — ABNORMAL LOW (ref 30.0–36.0)
MCV: 85.8 fL (ref 80.0–100.0)
Monocytes Absolute: 0.8 10*3/uL (ref 0.1–1.0)
Monocytes Relative: 9 %
NEUTROS ABS: 5.7 10*3/uL (ref 1.7–7.7)
NEUTROS PCT: 69 %
PLATELETS: 581 10*3/uL — AB (ref 150–400)
RBC: 3.24 MIL/uL — ABNORMAL LOW (ref 3.87–5.11)
RDW: 17.8 % — ABNORMAL HIGH (ref 11.5–15.5)
WBC: 8.1 10*3/uL (ref 4.0–10.5)
nRBC: 0 % (ref 0.0–0.2)

## 2018-11-22 LAB — BASIC METABOLIC PANEL
Anion gap: 9 (ref 5–15)
BUN: 36 mg/dL — ABNORMAL HIGH (ref 8–23)
CO2: 21 mmol/L — ABNORMAL LOW (ref 22–32)
Calcium: 8.7 mg/dL — ABNORMAL LOW (ref 8.9–10.3)
Chloride: 106 mmol/L (ref 98–111)
Creatinine, Ser: 2.98 mg/dL — ABNORMAL HIGH (ref 0.44–1.00)
GFR calc Af Amer: 18 mL/min — ABNORMAL LOW (ref >60)
GFR calc non Af Amer: 16 mL/min — ABNORMAL LOW (ref >60)
Glucose, Bld: 96 mg/dL (ref 70–99)
POTASSIUM: 4.7 mmol/L (ref 3.5–5.1)
Sodium: 136 mmol/L (ref 135–145)

## 2018-11-22 NOTE — Progress Notes (Signed)
Location:    [facility] Room Number: 103/P Place of Service:  SNF (31) Provider: Granville Lewis PA-C  Fayrene Helper, MD  Patient Care Team: Fayrene Helper, MD as PCP - General  Extended Emergency Contact Information Primary Emergency Contact: Smith,Beth Address: [address]          [place], [place] [address] [place] Phone: [phone] Mobile Phone: [phone] Relation: Daughter  Code Status:  Full Code Goals of care: Advanced Directive information Advanced Directives 11/22/2018  Does Patient Have a Medical Advance Directive? Yes  Type of Advance Directive (No Data)  Does patient want to make changes to medical advance directive? No - Patient declined     Chief Complaint  Patient presents with  . Hospitalization Follow-up    Hospitalization F/U Visit   Acute visit status post hospitalization for acute respiratory failure with pneumonia and bacteremia  HPI:  Pt is a 68 y.o. female seen today for follow-up of hospitalization for acute respiratory failure with pneumonia and bacteremia.  Apparently patient presented to [facility] in [place] and became at one point unresponsive with respiratory failure that required intubation and transfer to [facility] she was extubated on December 17.  Chest x-ray showed patchy opacities throughout the right lung blood cultures grew out strep pneumonia she did get antibiotic therapy she appears to have made a really nice recovery from this.  It appears she also had numerous other issues including acute kidney injury- creatinine was 3.86 on admission and apparently trended up to over 7.  Renal ultrasound was unremarkable she did not have a Foley at one point but this was discontinued creatinine did improve appears on discharge on December 30 was 3.18.  And it is 2.98 today  She also has thrombocytosis thought reactive because of her infections it was slowly trending  down there was recommendation possibly having an aggressive work-up for possible autoimmune disorder or malignancy if platelets remain elevated.  Patient also had elevated troponin in the hospital with EKG showing ST depressions in inferior lateral leads.  TTE was obtained that showed a depressed ejection fraction of 30-35% however this recovered up to 55-60% on repeat TTE.  Her Coreg was increased to 25 mg twice daily she was not given an ACE or R because of her acute kidney injury she is on a statin.  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  Recommendation was to continue atorvastatin as well as Plavix-- given no arthrosclerosis aspirin was not started.    Patient's hemoglobin was 9 on admission she did have a low iron and elevated ferritin this was thought to be an acute phase reactant she has been started on iron.  During her hospitalization hemoglobin was in the sevens but has improved up to 8.0 today  She also had an oral lesion and oral pain with ulcerations.  She received a mouthwash for this and apparently improved.  She also was found to have sinusitis with bilateral middle ear effusions there was no acute intervention this was thought to gradually improved she did receive nasal saline as well as Flonase.  Regards to hypertension she is on Coreg 25 twice daily as well as  nifedipine 90 mg a day.  She continues on Lexapro for depression and anxiety.  And has Tylenol as well for knee pain.  Currently she appears to have made a very nice recovery she is sitting in her room comfortably she is bright alert does not complain of persistent cough or shortness of breath vital signs appear to be stable she appears to be doing  quite well  Past Medical History:  Diagnosis Date  . Abnormal EKG   . Anemia    H&H of 10.3/33.5 in 12/2011  . Depression   . Hyperlipidemia   . Hypertension   . Obesity   . Pre-diabetes   . Vitamin D deficiency    Past Surgical History:  Procedure Laterality Date  . TOTAL VAGINAL HYSTERECTOMY      No Known Allergies  Outpatient Encounter Medications as of 11/22/2018  Medication Sig  . acetaminophen (TYLENOL) 500 MG tablet Take 1,000 mg by mouth every 6 (six) hours as needed.  Marland Kitchen atorvastatin (LIPITOR) 80 MG tablet Take 80 mg by mouth daily.  . carvedilol (COREG) 25 MG tablet Take 25 mg by mouth 2 (two) times daily with a meal.  . clopidogrel (PLAVIX) 75 MG tablet Take 75 mg by mouth daily.  Marland Kitchen escitalopram (LEXAPRO) 10 MG tablet Take 10 mg by mouth daily.  . ferrous sulfate (KP FERROUS SULFATE) 325 (65 FE) MG tablet Take 325 mg by mouth 2 (two) times daily with a meal.  . NIFEdipine (PROCARDIA XL/ADALAT-CC) 90 MG 24 hr tablet Take 1 tablet (90 mg total) by mouth daily.  Marland Kitchen aspirin 81 MG tablet Take 81 mg by mouth daily.  . cloNIDine (CATAPRES) 0.2 MG tablet Take 0.5 tablets (0.1 mg total) by mouth 2 (two) times daily.  . ergocalciferol (VITAMIN D2) 50000 UNITS capsule Take 1 capsule (50,000 Units total) by mouth once a week. One capsule once weekly  . indomethacin (INDOCIN) 25 MG capsule One capsule 3 times daily for 2 days , then twice daily for 2 days then one daily for 3 days  . PARoxetine (PAXIL) 20 MG tablet TAKE ONE TABLET BY MOUTH EVERY DAY  . predniSONE (STERAPRED UNI-PAK) 5 MG TABS tablet Take 1 tablet (5 mg total) by mouth as directed.  . triamterene-hydrochlorothiazide (MAXZIDE) 75-50 MG per tablet Take 1 tablet by mouth daily.   No facility-administered encounter medications on file as of 11/22/2018.      Review of Systems   General she not complaining of any fever or chills.  Skin does not complain of rashes or itching.  Head ears eyes nose mouth and throat says  her mouth discomfort has improved is not complaining of a sore throat or nasal discharge or ear pain.  Respiratory-has had an occasional cough but is not complaining of  shortness of breath at this time.  Cardiac is not complaining of chest pain has  mild edema although this appears to be I believe rather baseline.  GI is not complaining of abdominal pain nausea vomiting diarrhea or constipation.  GU is not complaining of dysuria.  Musculoskeletal has weakness but is not complaining of joint pain at this time does have a history of chronic knee pain.  Neurologic does not complain of dizziness headache numbness.  And psych appears to be in good spirits does have a history of depression but at this point appears to be well controlled  Immunization History  Administered Date(s) Administered  . Td 02/25/2009  . Zoster 12/27/2011   Pertinent  Health Maintenance  Due  Topic Date Due  . MAMMOGRAM  01/29/2016  . INFLUENZA VACCINE  12/23/2018 (Originally 06/21/2018)  . DEXA SCAN  12/23/2018 (Originally 07/21/2016)  . COLONOSCOPY  12/23/2018 (Originally 07/21/2001)  . PNA vac Low Risk Adult (1 of 2 - PCV13) 12/23/2018 (Originally 07/21/2016)   No flowsheet data found. Functional Status Survey:    Temperature is 98.4 pulse 92 respirations 20 blood pressure 142/86 O2 saturation is 95% on room air.    Physical Exam   In general this is a very pleasant elderly female distress sitting comfortably on the side of her bed.  Her skin is warm and dry.  Eyes visual acuity appears to be intact sclera and conjunctive are clear.  Oropharynx is clear mucous membranes moist.  Chest is clear to auscultation with reduced air entry there is no labored breathing.  Heart is regular rate and rhythm without murmur gallop or rub she has mild lower extremity edema appears most likely dependent edema.  Abdomen is obese soft nontender with positive bowel sounds.  Musculoskeletal moves all extremities  x4--appears at relative baseline strength.  Neurologic is grossly intact her speech is clear no lateralizing findings.  Psych she is alert and oriented very pleasant and appropriate  Labs reviewed: No results for input(s): NA, K, CL, CO2, GLUCOSE, BUN, CREATININE, CALCIUM, MG, PHOS in the last 8760 hours. No results for input(s): AST, ALT, ALKPHOS, BILITOT, PROT, ALBUMIN in the last 8760 hours. No results for input(s): WBC, NEUTROABS, HGB, HCT, MCV, PLT in the last 8760 hours. Lab Results  Component Value Date   TSH 2.095 05/27/2013   Lab Results  Component Value Date   HGBA1C 6.4 (H) 04/21/2014   Lab Results  Component Value Date   CHOL 215 (H) 04/21/2014   HDL 52 04/21/2014   LDLCALC 142 (H) 04/21/2014   TRIG 104 04/21/2014   CHOLHDL 4.1 04/21/2014    Significant Diagnostic Results in last 30 days:  No results found.  Assessment/Plan  #1 history of acute hypoxic respiratory failure she has made a remarkable recovery from this she did receive intubation in the hospital as well as antibiotics secondary to a positive blood culture for strep pneumonia- at this point appears to be essentially asymptomatic.  2.  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  3.  Anemia thought to have an element of iron deficiency she is on iron hemoglobin on December 30 was 7.3 --it has risen up to 8.0 on today's lab  4.-  History of reduced ejection fraction again before discharge this improved up to 55 to 60%- her Coreg was increased to 25 mg twice daily not on an ACE or aARB secondary to her increased renal insufficiency.  She is also on a statin.  5.-  History of acute kidney injury with creatinine apparently over 7 at one-point on December 30 this -today's  lab creatinine is slowly trending down was 3.18 with a BUN of 45--- on today's lab creatinine is slowly trending down to 2.98 with a BUN of 36.     6.-  History of thrombocytosis thought possibly to be reactive this appeared to be slowly trending down-it was 605,000 on December 30 at this point will monitor suggestion for possible more aggressive work-up if this  does not continue to improve.-Update lab today shows that has gone down somewhat to 581,000  7.-  History of l mucosal cytosis she appears to be asymptomatic status post mouthwash she received during her hospitalization.  8.-  History of hypertension she continues on Coreg 25 mg twice daily as well as nifedipine 90 mg a day at this point will monitor systolic blood pressure today is mildly elevated at 142 but would like to get more readings before making any significant adjustments.  9.-  History of depression anxiety this appears stable on Lexapro.  10.  History of chronic knee pain she is on Tylenol at this point appears to be controlled she is not complaining of pain today.    [id number] note greater than 45 minutes spent assessing patient- reviewing her chart and labs- coordinating and formulating a plan of care for numerous diagnoses-of note greater than 50% of time spent coordinating a plan of care for numerous diagnoses

## 2018-11-23 ENCOUNTER — Non-Acute Institutional Stay (SKILLED_NURSING_FACILITY): Payer: Medicare Other | Admitting: Internal Medicine

## 2018-11-23 ENCOUNTER — Encounter: Payer: Self-pay | Admitting: Internal Medicine

## 2018-11-23 DIAGNOSIS — J0140 Acute pansinusitis, unspecified: Secondary | ICD-10-CM | POA: Diagnosis not present

## 2018-11-23 DIAGNOSIS — D509 Iron deficiency anemia, unspecified: Secondary | ICD-10-CM

## 2018-11-23 DIAGNOSIS — I6389 Other cerebral infarction: Secondary | ICD-10-CM

## 2018-11-23 DIAGNOSIS — J9601 Acute respiratory failure with hypoxia: Secondary | ICD-10-CM

## 2018-11-23 DIAGNOSIS — J9691 Respiratory failure, unspecified with hypoxia: Secondary | ICD-10-CM

## 2018-11-23 DIAGNOSIS — D473 Essential (hemorrhagic) thrombocythemia: Secondary | ICD-10-CM | POA: Diagnosis not present

## 2018-11-23 DIAGNOSIS — D75839 Thrombocytosis, unspecified: Secondary | ICD-10-CM

## 2018-11-23 DIAGNOSIS — N179 Acute kidney failure, unspecified: Secondary | ICD-10-CM

## 2018-11-23 DIAGNOSIS — J324 Chronic pansinusitis: Secondary | ICD-10-CM

## 2018-11-23 DIAGNOSIS — I639 Cerebral infarction, unspecified: Secondary | ICD-10-CM | POA: Insufficient documentation

## 2018-11-23 HISTORY — DX: Respiratory failure, unspecified with hypoxia: J96.91

## 2018-11-23 HISTORY — DX: Cerebral infarction, unspecified: I63.9

## 2018-11-23 HISTORY — DX: Chronic pansinusitis: J32.4

## 2018-11-23 HISTORY — DX: Acute kidney failure, unspecified: N17.9

## 2018-11-23 NOTE — Progress Notes (Signed)
Provider: Hendricks Limes MD.  Location:    [place] Room Number: 103/P Place of Service:  SNF [phone])  PCP: Fayrene Helper, MD Patient Care Team: Fayrene Helper, MD as PCP - General  Extended Emergency Contact Information Primary Emergency Contact: Smith,Beth Address: [address]          [place], [place] [address] [place] Phone: [phone] Mobile Phone: [phone] Relation: Daughter  Code Status: Full Code Goals of Care: Advanced Directive information Advanced Directives 11/23/2018  Does Patient Have a Medical Advance Directive? Yes  Type of Advance Directive (No Data)  Does patient want to make changes to medical advance directive? No - Patient declined   Chief Complaint  Patient presents with  . New Admit To SNF    New Admission Visit  HPI: Patient is a 68 y.o. female seen today for admission to [facility] SNF. The patient was admitted to the [facility] 11/20/2018 after being an inpatient @ [facility] from 12/16-12/31/2019 with hypoxic respiratory failure and strep pneumoniae bacteremia. 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  On 12/16 her son-in-law went to check on her and found her unresponsive with increased work of breathing.  EMS transported her to [facility] ED.  On arrival patient was GCS 6 requiring intubation for airway protection.  CT of the head revealed no acute changes.  Chest CT revealed bilateral infiltrates.  Creatinine was 3.88 and white count 20,700.  Empirically ceftriaxone and azithromycin were initiated prior to transfer to MICU.  RVP was obtained which demonstrated RSV positive.  Blood cultures collected at the outside hospital were positive for strep pneumonia.  With aggressive intervention the patient gradually improved and was extubated 12/17.  She continued to have waxing/waning mental status with associated decreased hearing.  CT was  repeated which showed age-indeterminate hypodensities in the brain and also complete opacification of bilateral maxillary/ethmoid sinus and right frontal sinus.  She also had an effusion within the bilateral middle ear spaces.  ENT recommended no acute intervention beyond continuing with antibiotics.  MRI revealed multifocal subacute infarcts.  Encephalopathy work-up included B12, negative RPR, ammonia 46, therapeutic TSH, and SPEP with no M spike.  UPEP revealed elevated protein/creatinine ratio of 900.  HIV and hepatitis panels were negative.  Plavix and statins were initiated.  Nephrology consulted as the patient was initially anuric.  Renal ultrasound was unremarkable.  The etiology of the AKI was felt to be ATN.  Catheter was removed prior to transfer to the ACE unit. Troponin peaked at 19.822 and EKG showed ST depressions in the inferior lateral leads.  Transtracheal echocardiogram 12/17 revealed depressed ejection fraction of 30-35%.  Repeat TTE on 12/23 revealed ejection fraction 55-60%.  As of 12/29 patient was felt to be medically stable for discharge from the hospital.  PT/OT was recommended in SNF.  At discharge an Ezio heart patch monitor was prescribed.   Past Medical History:  Diagnosis Date  . Abnormal EKG   . Anemia    H&H of 10.3/33.5 in 12/2011  . Depression   . Hyperlipidemia   . Hypertension   . Obesity   . Pre-diabetes   . Vitamin D deficiency    Past Surgical History:  Procedure Laterality Date  . TOTAL VAGINAL HYSTERECTOMY      reports that she has never smoked. She has never used smokeless tobacco. She reports that she does not drink alcohol  or use drugs. Social History   Socioeconomic History  . Marital status: Divorced    Spouse name: Not on file  . Number of children: Not on file  . Years of education: Not on file  . Highest education level: Not on file  Occupational History  . Not on file  Social Needs  . Financial resource strain: Not on file  . Food  insecurity:    Worry: Not on file    Inability: Not on file  . Transportation needs:    Medical: Not on file    Non-medical: Not on file  Tobacco Use  . Smoking status: Never Smoker  . Smokeless tobacco: Never Used  Substance and Sexual Activity  . Alcohol use: No  . Drug use: No  . Sexual activity: Not on file  Lifestyle  . Physical activity:    Days per week: Not on file    Minutes per session: Not on file  . Stress: Not on file  Relationships  . Social connections:    Talks on phone: Not on file    Gets together: Not on file    Attends religious service: Not on file    Active member of club or organization: Not on file    Attends meetings of clubs or organizations: Not on file    Relationship status: Not on file  . Intimate partner violence:    Fear of current or ex partner: Not on file    Emotionally abused: Not on file    Physically abused: Not on file    Forced sexual activity: Not on file  Other Topics Concern  . Not on file  Social History Narrative  . Not on file     Family History  Problem Relation Age of Onset  . Heart attack Father   . Heart attack Mother   . Cancer Sister   . Hypertension Sister   . Hypertension Brother     Health Maintenance  Topic Date Due  . MAMMOGRAM  01/29/2016  . INFLUENZA VACCINE  12/23/2018 (Originally 06/21/2018)  . DEXA SCAN  12/23/2018 (Originally 07/21/2016)  . COLONOSCOPY  12/23/2018 (Originally 07/21/2001)  . Hepatitis C Screening  12/23/2018 (Originally [birth year])  . PNA vac Low Risk Adult (1 of 2 - PCV13) 12/23/2018 (Originally 07/21/2016)  . TETANUS/TDAP  02/26/2019    No Known Allergies  Outpatient Encounter Medications as of 11/23/2018  Medication Sig  . acetaminophen (TYLENOL) 500 MG tablet Take 1,000 mg by mouth every 6 (six) hours as needed.  Marland Kitchen atorvastatin (LIPITOR) 80 MG tablet Take 80 mg by mouth daily.  . carvedilol (COREG) 25 MG tablet Take 25 mg by mouth 2 (two) times daily with a meal.  . clopidogrel  (PLAVIX) 75 MG tablet Take 75 mg by mouth daily.  Marland Kitchen escitalopram (LEXAPRO) 10 MG tablet Take 10 mg by mouth daily.  . ferrous sulfate (KP FERROUS SULFATE) 325 (65 FE) MG tablet Take 325 mg by mouth 2 (two) times daily with a meal.  . NIFEdipine (PROCARDIA XL/ADALAT-CC) 90 MG 24 hr tablet Take 1 tablet (90 mg total) by mouth daily.  . cloNIDine (CATAPRES) 0.2 MG tablet Take 0.5 tablets (0.1 mg total) by mouth 2 (two) times daily.  . [DISCONTINUED] aspirin 81 MG tablet Take 81 mg by mouth daily.  . [DISCONTINUED] ergocalciferol (VITAMIN D2) 50000 UNITS capsule Take 1 capsule (50,000 Units total) by mouth once a week. One capsule once weekly  . [DISCONTINUED] indomethacin (INDOCIN) 25 MG capsule One capsule  3 times daily for 2 days , then twice daily for 2 days then one daily for 3 days  . [DISCONTINUED] PARoxetine (PAXIL) 20 MG tablet TAKE ONE TABLET BY MOUTH EVERY DAY  . [DISCONTINUED] predniSONE (STERAPRED UNI-PAK) 5 MG TABS tablet Take 1 tablet (5 mg total) by mouth as directed.  . [DISCONTINUED] triamterene-hydrochlorothiazide (MAXZIDE) 75-50 MG per tablet Take 1 tablet by mouth daily.   No facility-administered encounter medications on file as of 11/23/2018.   Review of Systems: She states that she has a slight cough which is nonproductive.  She has no other cardiopulmonary symptoms.  Despite the incredible history she also denies any upper respiratory tract or ENT symptoms.  She does have some discomfort in the left anterior jaw which she feels is related to sleeping in the left lateral decubitus position.  She had similar symptoms on the right when she was sleeping in the right lateral decubitus position.  She has some loose stool without frank diarrhea.  She states that she has had a history of some snoring without apnea.  She categorically denies any other positive symptoms.  Constitutional: No fever, chills, significant weight change, fatigue, weakness or night sweats Eyes: No redness,  discharge, pain, blurred vision, double vision, or loss of vision ENT/mouth: No nasal congestion, postnasal drainage,epistaxis, purulent discharge, earache,  tinnitus ,sore throat or hoarseness   Cardiovascular: no chest pain, palpitations, racing, irregular rhythm, syncope, nausea, sweating, claudication, or edema  Respiratory: No sputum production,hemoptysis,  dyspnea, paroxysmal nocturnal dyspnea, pleuritic chest pain    Gastrointestinal: No heartburn,dysphagia, nausea and vomiting,ominal pain, anorexia, diarrhea, significant constipation, rectal bleeding, melena,  stool incontinence or jaundice Genitourinary: No dysuria,hematuria, pyuria, frequency, urgency,  incontinence, nocturia, dark urine or flank pain Musculoskeletal: No myalgias or muscle cramping, joint stiffness, joint swelling, joint color change, weakness, or cyanosis Dermatologic: No rash, pruritus, urticaria, or change in color or temperature of skin Neurologic: No headache, vertigo, limb weakness, tremor, gait disturbance, seizures, memory loss, numbness or tingling Psychiatric: No significant anxiety or depression, anhedonia, panic attacks, insomnia, or anorexia Endocrine: No change in hair/skin/ nails, excessive thirst, excessive hunger, excessive urination, or unexplained fatigue Hematologic/lymphatic: No bruising, lymphadenopathy,or  abnormal clotting Allergy/immunology: No itchy/ watery eyes, abnormal sneezing, rhinitis, urticaria ,or angioedema   Vitals:   11/23/18 1201  BP: 130/90  Pulse: (!) 104  Resp: 19  Temp: 97.7 F (36.5 C)  TempSrc: Oral  SpO2: 98%   There is no height or weight on file to calculate BMI. Physical Exam : Significant or distinguishing  findings on physical exam are documented first. Below that are other systems examined & findings. She is slightly hard of hearing.  Proptosis of the globes is suggested.  She has multiple missing mandibular teeth.  Remaining teeth have marked plaque formation.   Breath sounds are somewhat decreased.  Gen.: Healthy and well-nourished in appearance. Alert, appropriate and cooperative throughout exam. Appears younger than stated age  Head: Normocephalic without obvious abnormalities Eyes: No corneal or conjunctival inflammation noted. Pupils equal round reactive to light  Ears: External  ear exam reveals no significant lesions or deformities.  Nose: External nasal exam reveals no deformity or inflammation. Nasal mucosa are pink and moist. No lesions or exudates noted.   Mouth: Oral mucosa and oropharynx reveal no lesions or exudates.  Neck: No deformities, masses, or tenderness noted. Thyroid normal. Lungs:  without rales, wheezes, or increased work of breathing. Heart: Normal rate (not tachy as recorded in VS) and rhythm. Normal S1  and S2. No gallop, click, or rub.  Abdomen: Bowel sounds normal; abdomen soft and nontender. No masses, organomegaly or hernias noted. Genitalia: deferred                                  Musculoskeletal/extremities: No clubbing, cyanosis, edema, or significant extremity  deformity noted. Vascular: Carotid, radial artery, dorsalis pedis and  posterior tibial pulses are full and equal. No bruits present. Neurologic: Alert and oriented x3. Rhomberg & finger to nose could not be tested due to state.      Skin: Intact without suspicious lesions or rashes. Lymph: No cervical, axillary lymphadenopathy present. Psych: Mood and affect are normal. Normally interactive                                                                                Labs reviewed: Basic Metabolic Panel: Recent Labs    11/22/18 1030  NA 136  K 4.7  CL 106  CO2 21*  GLUCOSE 96  BUN 36*  CREATININE 2.98*  CALCIUM 8.7*   Liver Function Tests: No results for input(s): AST, ALT, ALKPHOS, BILITOT, PROT, ALBUMIN in the last 8760 hours. No results for input(s): LIPASE, AMYLASE in the last 8760 hours. No results for input(s): AMMONIA in the last  8760 hours. CBC: Recent Labs    11/22/18 1030  WBC 8.1  NEUTROABS 5.7  HGB 8.0*  HCT 27.8*  MCV 85.8  PLT 581*   Cardiac Enzymes: No results for input(s): CKTOTAL, CKMB, CKMBINDEX, TROPONINI in the last 8760 hours. BNP: Invalid input(s): POCBNP Lab Results  Component Value Date   HGBA1C 6.4 (H) 04/21/2014   Lab Results  Component Value Date   TSH 2.095 05/27/2013   Lab Results  Component Value Date   IRON 46 05/27/2013   TIBC 304 08/09/2007   FERRITIN 67 05/27/2013   See assessment and plan under each diagnosis in the problem list and acutely for this visit

## 2018-11-23 NOTE — Assessment & Plan Note (Signed)
Patient is alert and oriented with no encephalopathic findings

## 2018-11-23 NOTE — Patient Instructions (Addendum)
See assessment and plan under each diagnosis in the problem list and acutely for this visit ?Total time 53 minutes; greater than 50% of the visit spent counseling patient and coordinating care for problems addressed at this encounter ? ?

## 2018-11-23 NOTE — Assessment & Plan Note (Addendum)
11/22/2018 creatinine 2.98

## 2018-11-23 NOTE — Assessment & Plan Note (Signed)
Patient has had a miraculous recovery and is essentially asymptomatic from a cardiopulmonary standpoint

## 2018-11-23 NOTE — Assessment & Plan Note (Signed)
Residual slight hearing loss

## 2018-11-24 ENCOUNTER — Encounter: Payer: Self-pay | Admitting: Internal Medicine

## 2018-11-25 ENCOUNTER — Encounter: Payer: Self-pay | Admitting: Internal Medicine

## 2018-11-25 DIAGNOSIS — D75839 Thrombocytosis, unspecified: Secondary | ICD-10-CM | POA: Insufficient documentation

## 2018-11-25 DIAGNOSIS — D473 Essential (hemorrhagic) thrombocythemia: Secondary | ICD-10-CM | POA: Insufficient documentation

## 2018-11-25 HISTORY — DX: Thrombocytosis, unspecified: D75.839

## 2018-11-25 NOTE — Assessment & Plan Note (Signed)
11/22/2018 platelet count 581,000 Continue Plavix

## 2018-11-25 NOTE — Assessment & Plan Note (Addendum)
11/22/2018 H/H 8/27.8 Continue oral iron supplement

## 2018-12-05 ENCOUNTER — Encounter: Payer: Self-pay | Admitting: Internal Medicine

## 2018-12-05 ENCOUNTER — Non-Acute Institutional Stay (SKILLED_NURSING_FACILITY): Payer: Medicare Other | Admitting: Internal Medicine

## 2018-12-05 DIAGNOSIS — I10 Essential (primary) hypertension: Secondary | ICD-10-CM

## 2018-12-05 DIAGNOSIS — D508 Other iron deficiency anemias: Secondary | ICD-10-CM | POA: Diagnosis not present

## 2018-12-05 DIAGNOSIS — J9601 Acute respiratory failure with hypoxia: Secondary | ICD-10-CM | POA: Diagnosis not present

## 2018-12-05 DIAGNOSIS — D75839 Thrombocytosis, unspecified: Secondary | ICD-10-CM

## 2018-12-05 DIAGNOSIS — D473 Essential (hemorrhagic) thrombocythemia: Secondary | ICD-10-CM

## 2018-12-05 DIAGNOSIS — N179 Acute kidney failure, unspecified: Secondary | ICD-10-CM | POA: Diagnosis not present

## 2018-12-05 NOTE — Progress Notes (Signed)
Location:    [facility] Room Number: 103/P Place of Service:  SNF [phone])  Provider: Granville Lewis PA-C  PCP: Fayrene Helper, MD Patient Care Team: Fayrene Helper, MD as PCP - General  Extended Emergency Contact Information Primary Emergency Contact: Smith,Beth Address: [address]          [place], [place] [address] [place] of [place] Phone: [phone] Mobile Phone: [phone] Relation: Daughter  Code Status: Full Code Goals of care:  Advanced Directive information Advanced Directives 12/05/2018  Does Patient Have a Medical Advance Directive? Yes  Type of Advance Directive (No Data)  Does patient want to make changes to medical advance directive? No - Patient declined     No Known Allergies  Chief Complaint  Patient presents with  . Discharge Note    Discharge Visit    HPI:  68 y.o. female seen today for discharge from facility. Patient has a history of acute respiratory failure with blood cultures positive for strep pneumonia- as well as chronic kidney disease anemia depression and hypertension.  She was here for short-term rehab after hospitalization for acute respiratory failure for which she is made a fairly remarkable recovery.  At one point require intubation in the hospital as well as aggressive antibiotics secondary to a positive blood culture for strep pneumonia-she has been essentially asymptomatic here ambulating about facility with no complaints of shortness of breath or significant signs of respiratory discomfort.  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  She was prescribed a Zio patch at discharge with suspicion she may have A. Fib--but this has  not really been an issue during her stay here--she does have a Zio patch She also has significant anemia in the hospital thought to have an element of iron deficiency hemoglobin at one point was 7.3 in the hospital ---did rise to 8.0 on lab done here will recheck this before discharge she is on iron.  She also has significant acute kidney injury in the hospital with creatinine going over 7 at one-point- labs have showed gradual improvement on lab done on 2 January creatinine was down to 2.98 with a BUN of 36.  Will update this before discharge   She also has a history of mild thrombocytosis last platelet count mildly elevated at 581,000 will recheck this as well she is on Plavix.  In regards to hypertension this appears well controlled on Coreg as well as nifedipine.  Systolic was listed as mildly elevated today in the 150s but appears this is not consistent with blood pressures often in the lower 683M systolic--I got 196/22 manually Currently she has no complaints again has not really had any issues during her stay here she will be going home.       Past Medical History:  Diagnosis Date  . Abnormal EKG   . Anemia    H&H of 10.3/33.5 in 12/2011  . Depression   . Hyperlipidemia   . Hypertension   . Obesity   . Pre-diabetes   . Vitamin D deficiency     Past Surgical History:  Procedure Laterality Date  . TOTAL VAGINAL HYSTERECTOMY        reports that she has never smoked. She has never  used smokeless tobacco. She reports that she does not drink alcohol or use drugs. Social History   Socioeconomic History  . Marital status: Divorced    Spouse name: Not on file  . Number of children: Not on file  . Years of education: Not on file  . Highest education level: Not on file  Occupational History  . Not on file  Social Needs  . Financial resource strain: Not on file  . Food insecurity:    Worry: Not on file    Inability: Not on file  . Transportation needs:    Medical: Not  on file    Non-medical: Not on file  Tobacco Use  . Smoking status: Never Smoker  . Smokeless tobacco: Never Used  Substance and Sexual Activity  . Alcohol use: No  . Drug use: No  . Sexual activity: Not on file  Lifestyle  . Physical activity:    Days per week: Not on file    Minutes per session: Not on file  . Stress: Not on file  Relationships  . Social connections:    Talks on phone: Not on file    Gets together: Not on file    Attends religious service: Not on file    Active member of club or organization: Not on file    Attends meetings of clubs or organizations: Not on file    Relationship status: Not on file  . Intimate partner violence:    Fear of current or ex partner: Not on file    Emotionally abused: Not on file    Physically abused: Not on file    Forced sexual activity: Not on file  Other Topics Concern  . Not on file  Social History Narrative  . Not on file   Functional Status Survey:    No Known Allergies  Pertinent  Health Maintenance Due  Topic Date Due  . MAMMOGRAM  01/29/2016  . INFLUENZA VACCINE  12/23/2018 (Originally 06/21/2018)  . DEXA SCAN  12/23/2018 (Originally 07/21/2016)  . COLONOSCOPY  12/23/2018 (Originally 07/21/2001)  . PNA vac Low Risk Adult (1 of 2 - PCV13) 12/23/2018 (Originally 07/21/2016)    Medications: Outpatient Encounter Medications as of 12/05/2018  Medication Sig  . acetaminophen (TYLENOL) 500 MG tablet Take 1,000 mg by mouth every 6 (six) hours as needed.  Marland Kitchen atorvastatin (LIPITOR) 80 MG tablet Take 80 mg by mouth daily.  . carvedilol (COREG) 25 MG tablet Take 25 mg by mouth 2 (two) times daily with a meal.  . clopidogrel (PLAVIX) 75 MG tablet Take 75 mg by mouth daily.  Marland Kitchen escitalopram (LEXAPRO) 10 MG tablet Take 10 mg by mouth daily.  . ferrous sulfate (KP FERROUS SULFATE) 325 (65 FE) MG tablet Take 325 mg by mouth 2 (two) times daily with a meal.  . NIFEdipine (PROCARDIA XL/ADALAT-CC) 90 MG 24 hr tablet Take 1 tablet (90  mg total) by mouth daily.  . cloNIDine (CATAPRES) 0.2 MG tablet Take 0.5 tablets (0.1 mg total) by mouth 2 (two) times daily.   No facility-administered encounter medications on file as of 12/05/2018.      Review of Systems   In general she feels she is doing well does not complain of any fever chills.  Skin is not complain of rashes or itching.  Head ears eyes nose mouth and throat not complain of any visual changes or sore throat.  Respiratory says she has an occasional cough but nothing beyond baseline does not complain of shortness of  breath.  Cardiac does not complain of chest pain says she has some mild left ankle edema that appears to be dependent and clears up when she has her legs elevated at night.  GI is not complaining of abdominal pain nausea vomiting diarrhea constipation says her appetite has returned to baseline.  GU is not complaining of dysuria.  Musculoskeletal does not complain of joint pain.  Neurologic is not complaining of dizziness headache or syncope.  Psych does not complain of being overtly depressed or anxious she is on Lexapro with a history of depression- encephalopathy appears resolved  Vitals:   12/05/18 1045  BP: (!) 153/77  Pulse: 87  Resp: 20  Temp: 98.9 F (37.2 C)  TempSrc: Oral  SpO2: 98%  Weight is stable at around 234 pounds Physical Exam   General this is a very pleasant elderly female in no distress sitting comfortably on the side of her bed.  Her skin is warm and dry.  Eyes visual acuity appears to be intact sclera and conjunctive are clear.  Oropharynx mucous membranes are moist she has some slight residue of food on her tongue.-She has numerous extractions  Chest is clear to auscultation there is no labored breathing.  Heart is regular rate and rhythm without murmur gallop or rub she has some mild left lower extremity edema this is cool to touch nonerythematous nontender pedal pulses are palpable bilaterally.  She states  the edema resolves when she has her leg elevated  Abdomen is soft nontender with positive bowel sounds.  Musculoskeletal moves all extremities x4 she does ambulate and appears to be doing fairly well with this strength appears to be intact all 4 extremities.  Neurologic is grossly intact her speech is clear no lateralizing findings.  Psych she is alert and oriented very pleasant and appropriate  Labs reviewed: Basic Metabolic Panel: Recent Labs    11/22/18 1030  NA 136  K 4.7  CL 106  CO2 21*  GLUCOSE 96  BUN 36*  CREATININE 2.98*  CALCIUM 8.7*   Liver Function Tests: No results for input(s): AST, ALT, ALKPHOS, BILITOT, PROT, ALBUMIN in the last 8760 hours. No results for input(s): LIPASE, AMYLASE in the last 8760 hours. No results for input(s): AMMONIA in the last 8760 hours. CBC: Recent Labs    11/22/18 1030  WBC 8.1  NEUTROABS 5.7  HGB 8.0*  HCT 27.8*  MCV 85.8  PLT 581*   Cardiac Enzymes: No results for input(s): CKTOTAL, CKMB, CKMBINDEX, TROPONINI in the last 8760 hours. BNP: Invalid input(s): POCBNP CBG: No results for input(s): GLUCAP in the last 8760 hours.  Procedures and Imaging Studies During Stay: No results found.  Assessment/Plan:    #1 history of acute hypoxic respiratory failure again she has made a fairly remarkable cover he here-she was independent in the hospital and received antibiotics had a positive blood culture for strep pneumonia- she has been essentially asymptomatic here and doing well.  2.  History of encephalopathy again did have an MRI which showed several infarcts she is on a statin as well as Plavix and confusion has not really been an issue here continues to be pleasant appropriate-nursing does not report any issues.  3.  History of anemia thought to have an element of iron deficiency she is on iron hemoglobin was 8.0 earlier this month appear to be rising compared to hospital values will update this will need follow-up by  primary care provider.  4.  History of acute renal insufficiency her  creatinine at one point apparently was over 7 in the hospital and has trended down it was down to 2.98 on lab done earlier this month will update this before discharge and will need follow-up by primary care provider- unsure what her baseline creatinine is.  5.  History of a reduced ejection fraction in the hospital before discharge this improved up to 55-60% her Coreg was increased in the hospital she is not on an ACE orARB because of her renal insufficiency.  She does continue on a statin.  6.-  History of thrombocytosis this appears to be mild and apparently was trending down before discharge we will update this before discharge here.  7.  History of hypertension continues on Coreg 25 mg twice daily and nifedipine 90 mg a day- generally her blood pressures appear to be well controlled    Of note she does have a Zio patch-this will need follow-up by primary care provider  8.  History of depression this appears stable on Lexapro.  At this point will update labs tomorrow-she will need expedient primary care follow-up she will be going home she lives by herself and has been quite independent she appears to be essentially back at her baseline it appears.  She will get PT OT and nursing evaluation at home    [phone] note greater than 30 minutes spent on this discharge summary- greater than 50% of time spent coordinating a plan of care for numerous diagnoses

## 2018-12-06 ENCOUNTER — Encounter (HOSPITAL_COMMUNITY)
Admission: RE | Admit: 2018-12-06 | Discharge: 2018-12-06 | Disposition: A | Discharge status: Home / Self Care | Payer: BC Managed Care – PPO | Source: Skilled Nursing Facility | Attending: Internal Medicine | Admitting: Internal Medicine

## 2018-12-06 ENCOUNTER — Encounter: Payer: Self-pay | Admitting: Internal Medicine

## 2018-12-06 ENCOUNTER — Non-Acute Institutional Stay (SKILLED_NURSING_FACILITY): Payer: Medicare Other | Admitting: Internal Medicine

## 2018-12-06 DIAGNOSIS — J9691 Respiratory failure, unspecified with hypoxia: Secondary | ICD-10-CM | POA: Insufficient documentation

## 2018-12-06 DIAGNOSIS — D75839 Thrombocytosis, unspecified: Secondary | ICD-10-CM

## 2018-12-06 DIAGNOSIS — J329 Chronic sinusitis, unspecified: Secondary | ICD-10-CM | POA: Insufficient documentation

## 2018-12-06 DIAGNOSIS — D508 Other iron deficiency anemias: Secondary | ICD-10-CM

## 2018-12-06 DIAGNOSIS — N289 Disorder of kidney and ureter, unspecified: Secondary | ICD-10-CM | POA: Diagnosis not present

## 2018-12-06 DIAGNOSIS — I10 Essential (primary) hypertension: Secondary | ICD-10-CM

## 2018-12-06 DIAGNOSIS — I639 Cerebral infarction, unspecified: Secondary | ICD-10-CM | POA: Insufficient documentation

## 2018-12-06 DIAGNOSIS — J9601 Acute respiratory failure with hypoxia: Secondary | ICD-10-CM

## 2018-12-06 DIAGNOSIS — D473 Essential (hemorrhagic) thrombocythemia: Secondary | ICD-10-CM

## 2018-12-06 DIAGNOSIS — J069 Acute upper respiratory infection, unspecified: Secondary | ICD-10-CM | POA: Insufficient documentation

## 2018-12-06 LAB — BASIC METABOLIC PANEL
Anion gap: 7 (ref 5–15)
BUN: 23 mg/dL (ref 8–23)
CO2: 19 mmol/L — ABNORMAL LOW (ref 22–32)
Calcium: 8.7 mg/dL — ABNORMAL LOW (ref 8.9–10.3)
Chloride: 113 mmol/L — ABNORMAL HIGH (ref 98–111)
Creatinine, Ser: 2.15 mg/dL — ABNORMAL HIGH (ref 0.44–1.00)
GFR calc Af Amer: 27 mL/min — ABNORMAL LOW (ref >60)
GFR calc non Af Amer: 23 mL/min — ABNORMAL LOW (ref >60)
GLUCOSE: 89 mg/dL (ref 70–99)
Potassium: 3.7 mmol/L (ref 3.5–5.1)
Sodium: 139 mmol/L (ref 135–145)

## 2018-12-06 LAB — CBC WITH DIFFERENTIAL/PLATELET
Abs Immature Granulocytes: 0.02 10*3/uL (ref 0.00–0.07)
Basophils Absolute: 0 10*3/uL (ref 0.0–0.1)
Basophils Relative: 0 %
Eosinophils Absolute: 0.4 10*3/uL (ref 0.0–0.5)
Eosinophils Relative: 8 %
HEMATOCRIT: 25 % — AB (ref 36.0–46.0)
Hemoglobin: 7.2 g/dL — ABNORMAL LOW (ref 12.0–15.0)
Immature Granulocytes: 0 %
LYMPHS ABS: 0.8 10*3/uL (ref 0.7–4.0)
Lymphocytes Relative: 15 %
MCH: 25 pg — ABNORMAL LOW (ref 26.0–34.0)
MCHC: 28.8 g/dL — ABNORMAL LOW (ref 30.0–36.0)
MCV: 86.8 fL (ref 80.0–100.0)
Monocytes Absolute: 0.7 10*3/uL (ref 0.1–1.0)
Monocytes Relative: 13 %
Neutro Abs: 3.6 10*3/uL (ref 1.7–7.7)
Neutrophils Relative %: 64 %
Platelets: 327 10*3/uL (ref 150–400)
RBC: 2.88 MIL/uL — ABNORMAL LOW (ref 3.87–5.11)
RDW: 17.2 % — ABNORMAL HIGH (ref 11.5–15.5)
WBC: 5.6 10*3/uL (ref 4.0–10.5)
nRBC: 0 % (ref 0.0–0.2)

## 2018-12-06 NOTE — Progress Notes (Signed)
Location:    [facility] Room Number: 103/P Place of Service:  SNF [phone]) Provider:  Tera Helper, MD  Patient Care Team: Fayrene Helper, MD as PCP - General  Extended Emergency Contact Information Primary Emergency Contact: Smith,Beth Address: [address]          [place], [place] [address] [place] Phone: [phone] Mobile Phone: [phone] Relation: Daughter  Code Status:  Full Code Goals of care: Advanced Directive information Advanced Directives 12/06/2018  Does Patient Have a Medical Advance Directive? Yes  Type of Advance Directive (No Data)  Does patient want to make changes to medical advance directive? No - Patient declined     Chief Complaint  Patient presents with  . Acute Visit    Anemia    HPI:  Pt is a 68 y.o. female seen today for an acute visit for follow-up of anemia I will follow-up her appears to be chronic kidney disease.  Patient is scheduled for discharge home tomorrow after successfully completing rehab here.  She will be going home and has done very well.  She has a complicated hospitalization history with acute respiratory failure and blood cultures positive for strep pneumonia- other diagnoses include chronic kidney disease anemia depression and hypertension.  She also gives a somewhat vague history of anemia since she has received a transfusion in the past.  It appears blood work done during hospitalization showed an element of iron deficiency with low iron and iron saturation ferritin was elevated and was thought to be reactive Appears she was started on iron in the hospital twice a day.  Her hemoglobin initially taken the facility was 8.0 which was up from 7.3 in the hospital  However lab today shows hemoglobin is now 7.2.  In regards to her renal issues creatinine appears to continue to improve at 2.15 it was near 3 earlier in her stay here  Clinically she appears to be  doing very well has gained strength she is looking forward to going home will have PT OT as well as nursing evaluation.  We did test her for occult rectal bleeding which was negative today.       Past Medical History:  Diagnosis Date  . Abnormal EKG   . Anemia    H&H of 10.3/33.5 in 12/2011  . Depression   . Hyperlipidemia   . Hypertension   . Obesity   . Pre-diabetes   . Vitamin D deficiency    Past Surgical History:  Procedure Laterality Date  . TOTAL VAGINAL HYSTERECTOMY      No Known Allergies  Outpatient Encounter Medications as of 12/06/2018  Medication Sig  . acetaminophen (TYLENOL) 500 MG tablet Take 1,000 mg by mouth every 6 (six) hours as needed.  Marland Kitchen atorvastatin (LIPITOR) 80 MG tablet Take 80 mg by mouth daily.  . carvedilol (COREG) 25 MG tablet Take 25 mg by mouth 2 (two) times daily with a meal.  . clopidogrel (PLAVIX) 75 MG tablet Take 75 mg by mouth daily.  Marland Kitchen escitalopram (LEXAPRO) 10 MG tablet Take 10 mg by mouth daily.  . ferrous sulfate (KP FERROUS SULFATE) 325 (65 FE) MG tablet Take 325 mg by mouth 2 (two) times daily with a meal.  . NIFEdipine (PROCARDIA XL/ADALAT-CC) 90 MG 24 hr tablet Take 1 tablet (90 mg total) by mouth daily.  . cloNIDine (CATAPRES) 0.2 MG tablet Take 0.5 tablets (0.1 mg total) by mouth 2 (two) times daily.  No facility-administered encounter medications on file as of 12/06/2018.     Review of Systems   In general she continues to feel she is doing well is not complaining of fever chills or increased weakness.  Skin does not complain of rashes itching.  Head ears eyes nose mouth and throat no complaints of visual changes sore throat or difficulty swallowing.  Respiratory says she still has an occasional cough but this is no different than baseline-is not complaining of any shortness of breath.  Cardiac is not complaining of chest pain continues to have some mild left ankle dependent edema apparently this resolves with  elevation.  GI is not complaining of any abdominal pain nausea vomiting diarrhea constipation.  GU no complaints of dysuria.  Musculoskeletal has gained strength ambulates well in her walker does not complain of pain  Neurologic does not complain of syncope dizziness or headache.  And psych does not complain of being depressed or anxious-apparently had confusion in the hospital but this appears to have resolved  Immunization History  Administered Date(s) Administered  . Td 02/25/2009  . Zoster 12/27/2011   Pertinent  Health Maintenance Due  Topic Date Due  . MAMMOGRAM  01/29/2016  . INFLUENZA VACCINE  12/23/2018 (Originally 06/21/2018)  . DEXA SCAN  12/23/2018 (Originally 07/21/2016)  . COLONOSCOPY  12/23/2018 (Originally 07/21/2001)  . PNA vac Low Risk Adult (1 of 2 - PCV13) 12/23/2018 (Originally 07/21/2016)   No flowsheet data found. Functional Status Survey:    Temperature is 97.0 pulse 68 respirations 18 blood pressure 121/73 O2 saturation is 97%  Physical Exam   In general this continues to be a very pleasant elderly female in no distress.  Her skin is warm and dry.  Eyes visual acuity continues to be intact sclera and conjunctive are clear.  Oropharynx is clear mucous membranes moist she has numerous extractions.  Chest is clear to auscultation there is no labored breathing.  Heart is regular rate and rhythm without murmur gallop or rub has some quite mild left pedal ankle edema this is cool to touch nonerythematous radial pulses intact apparently this resolves with elevation.  Abdomen is obese soft nontender with positive bowel sounds.  Musculoskeletal is able to ambulate well with a walker does move all extremities x4 at baseline strength appears to be intact all 4 extremities.  Neurologic as noted above her speech is clear no lateralizing findings  Psych she is alert and oriented pleasant and appropriate  Labs reviewed: Recent Labs    11/22/18 1030  12/06/18 0742  NA 136 139  K 4.7 3.7  CL 106 113*  CO2 21* 19*  GLUCOSE 96 89  BUN 36* 23  CREATININE 2.98* 2.15*  CALCIUM 8.7* 8.7*   No results for input(s): AST, ALT, ALKPHOS, BILITOT, PROT, ALBUMIN in the last 8760 hours. Recent Labs    11/22/18 1030 12/06/18 0742  WBC 8.1 5.6  NEUTROABS 5.7 3.6  HGB 8.0* 7.2*  HCT 27.8* 25.0*  MCV 85.8 86.8  PLT 581* 327   Lab Results  Component Value Date   TSH 2.095 05/27/2013   Lab Results  Component Value Date   HGBA1C 6.4 (H) 04/21/2014   Lab Results  Component Value Date   CHOL 215 (H) 04/21/2014   HDL 52 04/21/2014   LDLCALC 142 (H) 04/21/2014   TRIG 104 04/21/2014   CHOLHDL 4.1 04/21/2014    Significant Diagnostic Results in last 30 days:  No results found.  Assessment/Plan  #1 history of anemia  thought to have an element of iron deficiency she is on iron hemoglobin has gone down somewhat down to 7.2 it was 8.0 earlier in her stay--on January 2.  We did test for occult blood testing digitally and this was negative.  We are trying to arrange expedient follow-up  by primary care provider tomorrow after she is discharged from facility tomorrow --- clinically she appears to be doing well and at baseline.  Apparently there is some history of chronic anemia per discussion with patient.  Of note thrombocytosis appears to have resolved platelet count is 327,000  2 history of acute renal insufficiency-her creatinine appears to be improving it is now down to 1 5- unsure what her baseline -- againexpedient follow-up by primary care provider would be important-but this appears to be trending in the right direction  #3 history of acute hypoxic respiratory failure she is made of remarkable recovery here- status post antibiotics and positive blood cultures in the hospital for strep pneumonia- has not really been an issue during her stay here.  4- history of encephalopathy she did have an MRI in the hospital showed several  infarct she is on a statin as well as Plavix-her confusion appears to have largely completely resolved.  5.  History of reduced ejection fraction in the hospital before discharge this improved up to 55-60% Coreg was increased in the hospital she is not on an ACE or an ARB secondary to her renal issues-she is on a statin.  6.  History of hypertension continues on Coreg 25 mg twice daily and nifedipine 90 mg a day this appears to be stable.  Of note she also has a Zio-patch--which will need follow-up by her primary care provider.  [id number]  Addendum-primary care follow-up has been scheduled for Monday, January 20--family has been made aware as well---again will need expedient follow-up of her hemoglobin-- clinically she appears to be doing quite well

## 2018-12-08 DIAGNOSIS — E559 Vitamin D deficiency, unspecified: Secondary | ICD-10-CM | POA: Diagnosis not present

## 2018-12-08 DIAGNOSIS — I129 Hypertensive chronic kidney disease with stage 1 through stage 4 chronic kidney disease, or unspecified chronic kidney disease: Secondary | ICD-10-CM | POA: Diagnosis not present

## 2018-12-08 DIAGNOSIS — D473 Essential (hemorrhagic) thrombocythemia: Secondary | ICD-10-CM | POA: Diagnosis not present

## 2018-12-08 DIAGNOSIS — Z90710 Acquired absence of both cervix and uterus: Secondary | ICD-10-CM | POA: Diagnosis not present

## 2018-12-08 DIAGNOSIS — I214 Non-ST elevation (NSTEMI) myocardial infarction: Secondary | ICD-10-CM | POA: Diagnosis not present

## 2018-12-08 DIAGNOSIS — D631 Anemia in chronic kidney disease: Secondary | ICD-10-CM | POA: Diagnosis not present

## 2018-12-08 DIAGNOSIS — Z7902 Long term (current) use of antithrombotics/antiplatelets: Secondary | ICD-10-CM | POA: Diagnosis not present

## 2018-12-08 DIAGNOSIS — F329 Major depressive disorder, single episode, unspecified: Secondary | ICD-10-CM | POA: Diagnosis not present

## 2018-12-08 DIAGNOSIS — Z8701 Personal history of pneumonia (recurrent): Secondary | ICD-10-CM | POA: Diagnosis not present

## 2018-12-08 DIAGNOSIS — R7303 Prediabetes: Secondary | ICD-10-CM | POA: Diagnosis not present

## 2018-12-08 DIAGNOSIS — J9691 Respiratory failure, unspecified with hypoxia: Secondary | ICD-10-CM | POA: Diagnosis not present

## 2018-12-08 DIAGNOSIS — E785 Hyperlipidemia, unspecified: Secondary | ICD-10-CM | POA: Diagnosis not present

## 2018-12-08 DIAGNOSIS — J329 Chronic sinusitis, unspecified: Secondary | ICD-10-CM | POA: Diagnosis not present

## 2018-12-08 DIAGNOSIS — N189 Chronic kidney disease, unspecified: Secondary | ICD-10-CM | POA: Diagnosis not present

## 2018-12-08 DIAGNOSIS — J069 Acute upper respiratory infection, unspecified: Secondary | ICD-10-CM | POA: Diagnosis not present

## 2018-12-08 DIAGNOSIS — E669 Obesity, unspecified: Secondary | ICD-10-CM | POA: Diagnosis not present

## 2018-12-11 DIAGNOSIS — D631 Anemia in chronic kidney disease: Secondary | ICD-10-CM | POA: Diagnosis not present

## 2018-12-11 DIAGNOSIS — J9691 Respiratory failure, unspecified with hypoxia: Secondary | ICD-10-CM | POA: Diagnosis not present

## 2018-12-11 DIAGNOSIS — I214 Non-ST elevation (NSTEMI) myocardial infarction: Secondary | ICD-10-CM | POA: Diagnosis not present

## 2018-12-11 DIAGNOSIS — N189 Chronic kidney disease, unspecified: Secondary | ICD-10-CM | POA: Diagnosis not present

## 2018-12-11 DIAGNOSIS — I129 Hypertensive chronic kidney disease with stage 1 through stage 4 chronic kidney disease, or unspecified chronic kidney disease: Secondary | ICD-10-CM | POA: Diagnosis not present

## 2018-12-11 DIAGNOSIS — F329 Major depressive disorder, single episode, unspecified: Secondary | ICD-10-CM | POA: Diagnosis not present

## 2018-12-13 DIAGNOSIS — J9691 Respiratory failure, unspecified with hypoxia: Secondary | ICD-10-CM | POA: Diagnosis not present

## 2018-12-13 DIAGNOSIS — I129 Hypertensive chronic kidney disease with stage 1 through stage 4 chronic kidney disease, or unspecified chronic kidney disease: Secondary | ICD-10-CM | POA: Diagnosis not present

## 2018-12-13 DIAGNOSIS — F329 Major depressive disorder, single episode, unspecified: Secondary | ICD-10-CM | POA: Diagnosis not present

## 2018-12-13 DIAGNOSIS — I214 Non-ST elevation (NSTEMI) myocardial infarction: Secondary | ICD-10-CM | POA: Diagnosis not present

## 2018-12-13 DIAGNOSIS — N189 Chronic kidney disease, unspecified: Secondary | ICD-10-CM | POA: Diagnosis not present

## 2018-12-13 DIAGNOSIS — D631 Anemia in chronic kidney disease: Secondary | ICD-10-CM | POA: Diagnosis not present

## 2018-12-17 DIAGNOSIS — I480 Paroxysmal atrial fibrillation: Secondary | ICD-10-CM | POA: Diagnosis not present

## 2018-12-18 DIAGNOSIS — Z79899 Other long term (current) drug therapy: Secondary | ICD-10-CM | POA: Diagnosis not present

## 2018-12-18 DIAGNOSIS — I110 Hypertensive heart disease with heart failure: Secondary | ICD-10-CM | POA: Diagnosis not present

## 2018-12-18 DIAGNOSIS — A599 Trichomoniasis, unspecified: Secondary | ICD-10-CM | POA: Diagnosis not present

## 2018-12-18 DIAGNOSIS — R809 Proteinuria, unspecified: Secondary | ICD-10-CM | POA: Diagnosis not present

## 2018-12-18 DIAGNOSIS — I10 Essential (primary) hypertension: Secondary | ICD-10-CM | POA: Diagnosis not present

## 2018-12-18 DIAGNOSIS — Z7902 Long term (current) use of antithrombotics/antiplatelets: Secondary | ICD-10-CM | POA: Diagnosis not present

## 2018-12-18 DIAGNOSIS — D649 Anemia, unspecified: Secondary | ICD-10-CM | POA: Diagnosis not present

## 2018-12-18 DIAGNOSIS — Z8701 Personal history of pneumonia (recurrent): Secondary | ICD-10-CM | POA: Diagnosis not present

## 2018-12-18 DIAGNOSIS — I5020 Unspecified systolic (congestive) heart failure: Secondary | ICD-10-CM | POA: Diagnosis not present

## 2018-12-18 DIAGNOSIS — Z8673 Personal history of transient ischemic attack (TIA), and cerebral infarction without residual deficits: Secondary | ICD-10-CM | POA: Diagnosis not present

## 2018-12-18 DIAGNOSIS — F419 Anxiety disorder, unspecified: Secondary | ICD-10-CM | POA: Diagnosis not present

## 2018-12-18 DIAGNOSIS — I252 Old myocardial infarction: Secondary | ICD-10-CM | POA: Diagnosis not present

## 2018-12-18 DIAGNOSIS — N179 Acute kidney failure, unspecified: Secondary | ICD-10-CM | POA: Diagnosis not present

## 2018-12-19 DIAGNOSIS — F329 Major depressive disorder, single episode, unspecified: Secondary | ICD-10-CM | POA: Diagnosis not present

## 2018-12-19 DIAGNOSIS — I129 Hypertensive chronic kidney disease with stage 1 through stage 4 chronic kidney disease, or unspecified chronic kidney disease: Secondary | ICD-10-CM | POA: Diagnosis not present

## 2018-12-19 DIAGNOSIS — J9691 Respiratory failure, unspecified with hypoxia: Secondary | ICD-10-CM | POA: Diagnosis not present

## 2018-12-19 DIAGNOSIS — D631 Anemia in chronic kidney disease: Secondary | ICD-10-CM | POA: Diagnosis not present

## 2018-12-19 DIAGNOSIS — I214 Non-ST elevation (NSTEMI) myocardial infarction: Secondary | ICD-10-CM | POA: Diagnosis not present

## 2018-12-19 DIAGNOSIS — N189 Chronic kidney disease, unspecified: Secondary | ICD-10-CM | POA: Diagnosis not present

## 2018-12-20 DIAGNOSIS — F329 Major depressive disorder, single episode, unspecified: Secondary | ICD-10-CM | POA: Diagnosis not present

## 2018-12-20 DIAGNOSIS — J9691 Respiratory failure, unspecified with hypoxia: Secondary | ICD-10-CM | POA: Diagnosis not present

## 2018-12-20 DIAGNOSIS — N189 Chronic kidney disease, unspecified: Secondary | ICD-10-CM | POA: Diagnosis not present

## 2018-12-20 DIAGNOSIS — I129 Hypertensive chronic kidney disease with stage 1 through stage 4 chronic kidney disease, or unspecified chronic kidney disease: Secondary | ICD-10-CM | POA: Diagnosis not present

## 2018-12-20 DIAGNOSIS — I214 Non-ST elevation (NSTEMI) myocardial infarction: Secondary | ICD-10-CM | POA: Diagnosis not present

## 2018-12-20 DIAGNOSIS — D631 Anemia in chronic kidney disease: Secondary | ICD-10-CM | POA: Diagnosis not present

## 2018-12-21 DIAGNOSIS — D631 Anemia in chronic kidney disease: Secondary | ICD-10-CM | POA: Diagnosis not present

## 2018-12-21 DIAGNOSIS — N189 Chronic kidney disease, unspecified: Secondary | ICD-10-CM | POA: Diagnosis not present

## 2018-12-21 DIAGNOSIS — F329 Major depressive disorder, single episode, unspecified: Secondary | ICD-10-CM | POA: Diagnosis not present

## 2018-12-21 DIAGNOSIS — J9691 Respiratory failure, unspecified with hypoxia: Secondary | ICD-10-CM | POA: Diagnosis not present

## 2018-12-21 DIAGNOSIS — I129 Hypertensive chronic kidney disease with stage 1 through stage 4 chronic kidney disease, or unspecified chronic kidney disease: Secondary | ICD-10-CM | POA: Diagnosis not present

## 2018-12-21 DIAGNOSIS — I214 Non-ST elevation (NSTEMI) myocardial infarction: Secondary | ICD-10-CM | POA: Diagnosis not present

## 2018-12-23 DIAGNOSIS — I472 Ventricular tachycardia: Secondary | ICD-10-CM | POA: Diagnosis not present

## 2018-12-23 DIAGNOSIS — I471 Supraventricular tachycardia: Secondary | ICD-10-CM | POA: Diagnosis not present

## 2018-12-26 DIAGNOSIS — I129 Hypertensive chronic kidney disease with stage 1 through stage 4 chronic kidney disease, or unspecified chronic kidney disease: Secondary | ICD-10-CM | POA: Diagnosis not present

## 2018-12-26 DIAGNOSIS — I214 Non-ST elevation (NSTEMI) myocardial infarction: Secondary | ICD-10-CM | POA: Diagnosis not present

## 2018-12-26 DIAGNOSIS — F329 Major depressive disorder, single episode, unspecified: Secondary | ICD-10-CM | POA: Diagnosis not present

## 2018-12-26 DIAGNOSIS — J9691 Respiratory failure, unspecified with hypoxia: Secondary | ICD-10-CM | POA: Diagnosis not present

## 2018-12-26 DIAGNOSIS — D631 Anemia in chronic kidney disease: Secondary | ICD-10-CM | POA: Diagnosis not present

## 2018-12-26 DIAGNOSIS — N189 Chronic kidney disease, unspecified: Secondary | ICD-10-CM | POA: Diagnosis not present

## 2018-12-27 ENCOUNTER — Telehealth: Payer: Self-pay

## 2018-12-27 DIAGNOSIS — J9691 Respiratory failure, unspecified with hypoxia: Secondary | ICD-10-CM | POA: Diagnosis not present

## 2018-12-27 DIAGNOSIS — N189 Chronic kidney disease, unspecified: Secondary | ICD-10-CM | POA: Diagnosis not present

## 2018-12-27 DIAGNOSIS — D631 Anemia in chronic kidney disease: Secondary | ICD-10-CM | POA: Diagnosis not present

## 2018-12-27 DIAGNOSIS — I129 Hypertensive chronic kidney disease with stage 1 through stage 4 chronic kidney disease, or unspecified chronic kidney disease: Secondary | ICD-10-CM | POA: Diagnosis not present

## 2018-12-27 DIAGNOSIS — I214 Non-ST elevation (NSTEMI) myocardial infarction: Secondary | ICD-10-CM | POA: Diagnosis not present

## 2018-12-27 DIAGNOSIS — F329 Major depressive disorder, single episode, unspecified: Secondary | ICD-10-CM | POA: Diagnosis not present

## 2018-12-27 NOTE — Telephone Encounter (Signed)
Sent referral to scheduling and filed notes 

## 2018-12-28 DIAGNOSIS — I129 Hypertensive chronic kidney disease with stage 1 through stage 4 chronic kidney disease, or unspecified chronic kidney disease: Secondary | ICD-10-CM | POA: Diagnosis not present

## 2018-12-28 DIAGNOSIS — N189 Chronic kidney disease, unspecified: Secondary | ICD-10-CM | POA: Diagnosis not present

## 2018-12-28 DIAGNOSIS — F329 Major depressive disorder, single episode, unspecified: Secondary | ICD-10-CM | POA: Diagnosis not present

## 2018-12-28 DIAGNOSIS — D631 Anemia in chronic kidney disease: Secondary | ICD-10-CM | POA: Diagnosis not present

## 2018-12-28 DIAGNOSIS — J9691 Respiratory failure, unspecified with hypoxia: Secondary | ICD-10-CM | POA: Diagnosis not present

## 2018-12-28 DIAGNOSIS — I214 Non-ST elevation (NSTEMI) myocardial infarction: Secondary | ICD-10-CM | POA: Diagnosis not present

## 2019-01-02 DIAGNOSIS — J9691 Respiratory failure, unspecified with hypoxia: Secondary | ICD-10-CM | POA: Diagnosis not present

## 2019-01-02 DIAGNOSIS — N189 Chronic kidney disease, unspecified: Secondary | ICD-10-CM | POA: Diagnosis not present

## 2019-01-02 DIAGNOSIS — D631 Anemia in chronic kidney disease: Secondary | ICD-10-CM | POA: Diagnosis not present

## 2019-01-02 DIAGNOSIS — I214 Non-ST elevation (NSTEMI) myocardial infarction: Secondary | ICD-10-CM | POA: Diagnosis not present

## 2019-01-02 DIAGNOSIS — F329 Major depressive disorder, single episode, unspecified: Secondary | ICD-10-CM | POA: Diagnosis not present

## 2019-01-02 DIAGNOSIS — I129 Hypertensive chronic kidney disease with stage 1 through stage 4 chronic kidney disease, or unspecified chronic kidney disease: Secondary | ICD-10-CM | POA: Diagnosis not present

## 2019-01-03 DIAGNOSIS — Z6838 Body mass index (BMI) 38.0-38.9, adult: Secondary | ICD-10-CM | POA: Diagnosis not present

## 2019-01-03 DIAGNOSIS — I252 Old myocardial infarction: Secondary | ICD-10-CM | POA: Diagnosis not present

## 2019-01-03 DIAGNOSIS — Z1389 Encounter for screening for other disorder: Secondary | ICD-10-CM | POA: Diagnosis not present

## 2019-01-03 DIAGNOSIS — I10 Essential (primary) hypertension: Secondary | ICD-10-CM | POA: Diagnosis not present

## 2019-01-03 DIAGNOSIS — R7309 Other abnormal glucose: Secondary | ICD-10-CM | POA: Diagnosis not present

## 2019-01-04 DIAGNOSIS — I129 Hypertensive chronic kidney disease with stage 1 through stage 4 chronic kidney disease, or unspecified chronic kidney disease: Secondary | ICD-10-CM | POA: Diagnosis not present

## 2019-01-04 DIAGNOSIS — N189 Chronic kidney disease, unspecified: Secondary | ICD-10-CM | POA: Diagnosis not present

## 2019-01-04 DIAGNOSIS — D631 Anemia in chronic kidney disease: Secondary | ICD-10-CM | POA: Diagnosis not present

## 2019-01-04 DIAGNOSIS — J9691 Respiratory failure, unspecified with hypoxia: Secondary | ICD-10-CM | POA: Diagnosis not present

## 2019-01-04 DIAGNOSIS — I214 Non-ST elevation (NSTEMI) myocardial infarction: Secondary | ICD-10-CM | POA: Diagnosis not present

## 2019-01-04 DIAGNOSIS — F329 Major depressive disorder, single episode, unspecified: Secondary | ICD-10-CM | POA: Diagnosis not present

## 2019-01-07 ENCOUNTER — Telehealth: Payer: Self-pay

## 2019-01-07 DIAGNOSIS — J069 Acute upper respiratory infection, unspecified: Secondary | ICD-10-CM | POA: Diagnosis not present

## 2019-01-07 DIAGNOSIS — Z90710 Acquired absence of both cervix and uterus: Secondary | ICD-10-CM | POA: Diagnosis not present

## 2019-01-07 DIAGNOSIS — E669 Obesity, unspecified: Secondary | ICD-10-CM | POA: Diagnosis not present

## 2019-01-07 DIAGNOSIS — J9691 Respiratory failure, unspecified with hypoxia: Secondary | ICD-10-CM | POA: Diagnosis not present

## 2019-01-07 DIAGNOSIS — E785 Hyperlipidemia, unspecified: Secondary | ICD-10-CM | POA: Diagnosis not present

## 2019-01-07 DIAGNOSIS — N189 Chronic kidney disease, unspecified: Secondary | ICD-10-CM | POA: Diagnosis not present

## 2019-01-07 DIAGNOSIS — R7303 Prediabetes: Secondary | ICD-10-CM | POA: Diagnosis not present

## 2019-01-07 DIAGNOSIS — D631 Anemia in chronic kidney disease: Secondary | ICD-10-CM | POA: Diagnosis not present

## 2019-01-07 DIAGNOSIS — F329 Major depressive disorder, single episode, unspecified: Secondary | ICD-10-CM | POA: Diagnosis not present

## 2019-01-07 DIAGNOSIS — I214 Non-ST elevation (NSTEMI) myocardial infarction: Secondary | ICD-10-CM | POA: Diagnosis not present

## 2019-01-07 DIAGNOSIS — Z8701 Personal history of pneumonia (recurrent): Secondary | ICD-10-CM | POA: Diagnosis not present

## 2019-01-07 DIAGNOSIS — I129 Hypertensive chronic kidney disease with stage 1 through stage 4 chronic kidney disease, or unspecified chronic kidney disease: Secondary | ICD-10-CM | POA: Diagnosis not present

## 2019-01-07 DIAGNOSIS — E559 Vitamin D deficiency, unspecified: Secondary | ICD-10-CM | POA: Diagnosis not present

## 2019-01-07 DIAGNOSIS — Z7902 Long term (current) use of antithrombotics/antiplatelets: Secondary | ICD-10-CM | POA: Diagnosis not present

## 2019-01-07 DIAGNOSIS — D473 Essential (hemorrhagic) thrombocythemia: Secondary | ICD-10-CM | POA: Diagnosis not present

## 2019-01-07 DIAGNOSIS — J329 Chronic sinusitis, unspecified: Secondary | ICD-10-CM | POA: Diagnosis not present

## 2019-01-07 NOTE — Telephone Encounter (Signed)
NOTES ON FILE RJ

## 2019-01-11 DIAGNOSIS — I129 Hypertensive chronic kidney disease with stage 1 through stage 4 chronic kidney disease, or unspecified chronic kidney disease: Secondary | ICD-10-CM | POA: Diagnosis not present

## 2019-01-11 DIAGNOSIS — D631 Anemia in chronic kidney disease: Secondary | ICD-10-CM | POA: Diagnosis not present

## 2019-01-11 DIAGNOSIS — F329 Major depressive disorder, single episode, unspecified: Secondary | ICD-10-CM | POA: Diagnosis not present

## 2019-01-11 DIAGNOSIS — N189 Chronic kidney disease, unspecified: Secondary | ICD-10-CM | POA: Diagnosis not present

## 2019-01-11 DIAGNOSIS — I214 Non-ST elevation (NSTEMI) myocardial infarction: Secondary | ICD-10-CM | POA: Diagnosis not present

## 2019-01-11 DIAGNOSIS — J9691 Respiratory failure, unspecified with hypoxia: Secondary | ICD-10-CM | POA: Diagnosis not present

## 2019-01-17 DIAGNOSIS — N189 Chronic kidney disease, unspecified: Secondary | ICD-10-CM | POA: Diagnosis not present

## 2019-01-17 DIAGNOSIS — I214 Non-ST elevation (NSTEMI) myocardial infarction: Secondary | ICD-10-CM | POA: Diagnosis not present

## 2019-01-17 DIAGNOSIS — I129 Hypertensive chronic kidney disease with stage 1 through stage 4 chronic kidney disease, or unspecified chronic kidney disease: Secondary | ICD-10-CM | POA: Diagnosis not present

## 2019-01-17 DIAGNOSIS — J9691 Respiratory failure, unspecified with hypoxia: Secondary | ICD-10-CM | POA: Diagnosis not present

## 2019-01-17 DIAGNOSIS — F329 Major depressive disorder, single episode, unspecified: Secondary | ICD-10-CM | POA: Diagnosis not present

## 2019-01-17 DIAGNOSIS — D631 Anemia in chronic kidney disease: Secondary | ICD-10-CM | POA: Diagnosis not present

## 2019-01-22 ENCOUNTER — Encounter: Payer: Self-pay | Admitting: Cardiology

## 2019-02-04 ENCOUNTER — Encounter: Payer: Self-pay | Admitting: Cardiology

## 2019-02-05 ENCOUNTER — Telehealth: Payer: Self-pay

## 2019-02-05 NOTE — Telephone Encounter (Signed)
She will need followup in the near future (2-4 weeks)

## 2019-02-05 NOTE — Telephone Encounter (Signed)
Left message to call back, advised her appointment was canceled.

## 2019-02-06 ENCOUNTER — Ambulatory Visit: Payer: BC Managed Care – PPO | Admitting: Cardiology

## 2019-02-18 DIAGNOSIS — K529 Noninfective gastroenteritis and colitis, unspecified: Secondary | ICD-10-CM | POA: Diagnosis not present

## 2019-02-21 DIAGNOSIS — Z6835 Body mass index (BMI) 35.0-35.9, adult: Secondary | ICD-10-CM | POA: Diagnosis not present

## 2019-02-21 DIAGNOSIS — E6609 Other obesity due to excess calories: Secondary | ICD-10-CM | POA: Diagnosis not present

## 2019-02-21 DIAGNOSIS — K529 Noninfective gastroenteritis and colitis, unspecified: Secondary | ICD-10-CM | POA: Diagnosis not present

## 2019-03-06 ENCOUNTER — Telehealth: Payer: Self-pay

## 2019-03-06 NOTE — Telephone Encounter (Signed)
I called and left patient a message about Telehealth visit with Dr. Radford Pax.

## 2019-03-12 NOTE — Telephone Encounter (Signed)
Attempted multiple times, patient will need to call back to be scheduled.

## 2019-04-10 ENCOUNTER — Telehealth: Payer: Self-pay | Admitting: Cardiology

## 2019-04-10 NOTE — Telephone Encounter (Signed)
Unable to reach to confirm [email or web address] and get verbal consent/

## 2019-04-12 ENCOUNTER — Telehealth: Payer: Self-pay

## 2019-04-12 NOTE — Telephone Encounter (Signed)
Called and left message for patient to return call so we can change upcoming appointment to virtual visit with Dr Radford Pax and get verbal consent.

## 2019-04-14 NOTE — Progress Notes (Deleted)
Virtual Visit via Video Note   This visit type was conducted due to national recommendations for restrictions regarding the COVID-19 Pandemic (e.g. social distancing) in an effort to limit this patient's exposure and mitigate transmission in our community.  Due to her co-morbid illnesses, this patient is at least at moderate risk for complications without adequate follow up.  This format is felt to be most appropriate for this patient at this time.  All issues noted in this document were discussed and addressed.  A limited physical exam was performed with this format.  Please refer to the patient's chart for her consent to telehealth for [facility].   Evaluation Performed:  Follow-up visit  This visit type was conducted due to national recommendations for restrictions regarding the COVID-19 Pandemic (e.g. social distancing).  This format is felt to be most appropriate for this patient at this time.  All issues noted in this document were discussed and addressed.  No physical exam was performed (except for noted visual exam findings with Video Visits).  Please refer to the patient's chart (MyChart message for video visits and phone note for telephone visits) for the patient's consent to telehealth for [facility].  Date:  04/16/2019   ID:  [place], DOB Jan 06, 1951, MRN [id number]  Patient Location:  Home  Provider location:   [place]  PCP:  Fayrene Helper, MD  Cardiologist:  NEW Electrophysiologist:  None   Chief Complaint:  DCM  History of Present Illness:    Angel Davis is a 68 y.o. female who presents via audio/video conferencing for a telehealth visit today in referral by Tula Nakayama, MD for evaluation of DCM.  This is a 68yo AAF with a   The patient {does/does not:200015} have symptoms concerning for COVID-19 infection (fever, chills, cough, or new shortness of breath).    Prior CV studies:   The following studies were reviewed today:  ***  Past  Medical History:  Diagnosis Date  . Abnormal EKG   . Acute gout 06/29/2014  . AKI (acute kidney injury) ([place]) 11/23/2018   12/16-12/31/2019 [place] [facility] : peak Creatinine 4.39  . Anemia    H&H of 10.3/33.5 in 12/2011  . Cerebral infarction [facility]) 11/23/2018   12/16-12/31/2019 [facility] MRI performed for encephalopathy in the setting of strep pneumoniae bacteremia and acute hypoxic respiratory failure with findings of multifocal subacute infarcts Plavix and high dose statin initiated  . Depression   . DEPRESSION 03/13/2008   Qualifier: Diagnosis of  By: [name], [place]    . Essential hypertension 03/13/2008   Lab: Normal CMet in 12/2011 except creatinine of 1.35   . FATIGUE 02/25/2009   Qualifier: Diagnosis of  By: Moshe Cipro MD, [name]    . Gout flare 04/28/2014  . HAND PAIN, RIGHT 02/25/2009   Qualifier: Diagnosis of  By: Moshe Cipro MD, [name]    . Hyperlipidemia   . Hypertension   . Metabolic syndrome X 5/32/9924  . Obesity   . OBESITY 03/13/2008   Qualifier: Diagnosis of  By: [name], [place]    . Pansinusitis 11/23/2018   12/16-12/31/2019 [facility] CT revealed complete opacification of bilateral maxillary/ethmoid sinus and right frontal sinus.Effusion within bilateral middle air spaces ENT recommended antibiotics w/o surgical intervention  . Pre-diabetes   . Prediabetes 01/08/2012   HBa1C is 6.4 in 04/2014   . Renal insufficiency 02/01/2012  . Respiratory failure with hypoxia ([place]) 11/23/2018   12/16-12/31/2009 [facility] with hypoxic respiratory failure in the context of strep pneumonia bacteremia (on  blood cultures collected in [place] ED) Intubated in [facility] ED and transferred to [facility]    . Tachycardia 04/28/2014  . Thrombocytosis ([place]) 11/25/2018   11/16/2018 platelet count 685,000  . Vitamin D deficiency   . Vitamin D deficiency 01/08/2012   Past Surgical History:  Procedure Laterality Date  . TOTAL VAGINAL HYSTERECTOMY       No outpatient medications have been marked as taking for the 04/16/19 encounter  (Appointment) with Sueanne Margarita, MD.     Allergies:   Patient has no known allergies.   Social History   Tobacco Use  . Smoking status: Never Smoker  . Smokeless tobacco: Never Used  Substance Use Topics  . Alcohol use: No  . Drug use: No     Family Hx: The patient's family history includes Cancer in her sister; Heart attack in her father and mother; Hypertension in her brother and sister.  ROS:   Please see the history of present illness.    *** All other systems reviewed and are negative.   Labs/Other Tests and Data Reviewed:    Recent Labs: 12/06/2018: BUN 23; Creatinine, Ser 2.15; Hemoglobin 7.2; Platelets 327; Potassium 3.7; Sodium 139   Recent Lipid Panel Lab Results  Component Value Date/Time   CHOL 215 (H) 04/21/2014 09:09 AM   TRIG 104 04/21/2014 09:09 AM   HDL 52 04/21/2014 09:09 AM   CHOLHDL 4.1 04/21/2014 09:09 AM   LDLCALC 142 (H) 04/21/2014 09:09 AM    Wt Readings from Last 3 Encounters:  04/28/14 239 lb 12.8 oz (108.8 kg)  05/29/13 238 lb (108 kg)  07/24/12 236 lb 1.9 oz (107.1 kg)     Objective:    Vital Signs:  There were no vitals taken for this visit.   CONSTITUTIONAL:  Well nourished, well developed female in no*** acute distress.  EYES: anicteric MOUTH: oral mucosa is pink RESPIRATORY: Normal respiratory effort, symmetric expansion CARDIOVASCULAR: No peripheral edema SKIN: No rash, lesions or ulcers MUSCULOSKELETAL: no digital cyanosis NEURO: Cranial Nerves II-XII grossly intact, moves all extremities PSYCH: Intact judgement and insight.  A&O x 3, Mood/affect appropriate   ASSESSMENT & PLAN:    1.  ***  COVID-19 Education: The signs and symptoms of COVID-19 were discussed with the patient and how to seek care for testing (follow up with PCP or arrange E-visit).  The importance of social distancing was discussed today.  Patient Risk:   After full review of this patient's clinical status, I feel that they are at least moderate  risk at this time.  Time:   Today, I have spent *** minutes directly with the patient on *** discussing medical problems including ***.  We also reviewed the symptoms of COVID 19 and the ways to protect against contracting the virus with telehealth technology.  I spent an additional *** minutes reviewing patient's chart including ***.  Medication Adjustments/Labs and Tests Ordered: Current medicines are reviewed at length with the patient today.  Concerns regarding medicines are outlined above.  Tests Ordered: No orders of the defined types were placed in this encounter.  Medication Changes: No orders of the defined types were placed in this encounter.   Disposition:  Follow up {follow up:15908}  Signed, Fransico Him, MD  04/16/2019 8:15 AM    [facility]

## 2019-04-16 ENCOUNTER — Telehealth: Payer: Self-pay | Admitting: Cardiology

## 2019-04-16 ENCOUNTER — Ambulatory Visit: Payer: Medicare Other | Admitting: Cardiology

## 2019-04-16 NOTE — Telephone Encounter (Signed)
New Message ° ° ° °Pt is returning call  ° ° ° °Please call back  °

## 2019-04-16 NOTE — Telephone Encounter (Signed)
She stated she will call back a different day to make an appointment.

## 2019-04-24 DIAGNOSIS — I10 Essential (primary) hypertension: Secondary | ICD-10-CM | POA: Diagnosis not present

## 2019-04-24 DIAGNOSIS — I63449 Cerebral infarction due to embolism of unspecified cerebellar artery: Secondary | ICD-10-CM | POA: Diagnosis not present

## 2019-04-24 DIAGNOSIS — N184 Chronic kidney disease, stage 4 (severe): Secondary | ICD-10-CM | POA: Diagnosis not present

## 2019-05-10 DIAGNOSIS — Z1389 Encounter for screening for other disorder: Secondary | ICD-10-CM | POA: Diagnosis not present

## 2019-05-10 DIAGNOSIS — N184 Chronic kidney disease, stage 4 (severe): Secondary | ICD-10-CM | POA: Diagnosis not present

## 2019-09-25 DIAGNOSIS — I5020 Unspecified systolic (congestive) heart failure: Secondary | ICD-10-CM | POA: Diagnosis not present

## 2019-09-25 DIAGNOSIS — I252 Old myocardial infarction: Secondary | ICD-10-CM | POA: Diagnosis not present

## 2019-09-25 DIAGNOSIS — N184 Chronic kidney disease, stage 4 (severe): Secondary | ICD-10-CM | POA: Diagnosis not present

## 2019-09-25 DIAGNOSIS — I130 Hypertensive heart and chronic kidney disease with heart failure and stage 1 through stage 4 chronic kidney disease, or unspecified chronic kidney disease: Secondary | ICD-10-CM | POA: Diagnosis not present

## 2019-09-25 DIAGNOSIS — D631 Anemia in chronic kidney disease: Secondary | ICD-10-CM | POA: Diagnosis not present

## 2020-01-23 DIAGNOSIS — Z23 Encounter for immunization: Secondary | ICD-10-CM | POA: Diagnosis not present

## 2020-02-18 DIAGNOSIS — N184 Chronic kidney disease, stage 4 (severe): Secondary | ICD-10-CM | POA: Diagnosis not present

## 2020-02-28 DIAGNOSIS — Z23 Encounter for immunization: Secondary | ICD-10-CM | POA: Diagnosis not present

## 2020-04-27 DIAGNOSIS — N184 Chronic kidney disease, stage 4 (severe): Secondary | ICD-10-CM | POA: Diagnosis not present

## 2020-05-05 ENCOUNTER — Other Ambulatory Visit: Payer: Self-pay | Admitting: *Deleted

## 2020-05-05 ENCOUNTER — Other Ambulatory Visit (HOSPITAL_COMMUNITY): Payer: Self-pay | Admitting: *Deleted

## 2020-05-05 DIAGNOSIS — N184 Chronic kidney disease, stage 4 (severe): Secondary | ICD-10-CM

## 2020-05-11 ENCOUNTER — Other Ambulatory Visit: Payer: Self-pay

## 2020-05-11 ENCOUNTER — Ambulatory Visit (HOSPITAL_COMMUNITY)
Admission: RE | Admit: 2020-05-11 | Discharge: 2020-05-11 | Disposition: A | Discharge status: Home / Self Care | Payer: Medicare Other | Source: Ambulatory Visit | Attending: *Deleted | Admitting: *Deleted

## 2020-05-11 DIAGNOSIS — N184 Chronic kidney disease, stage 4 (severe): Secondary | ICD-10-CM | POA: Insufficient documentation

## 2020-05-26 DIAGNOSIS — N184 Chronic kidney disease, stage 4 (severe): Secondary | ICD-10-CM | POA: Diagnosis not present

## 2020-06-12 DIAGNOSIS — I132 Hypertensive heart and chronic kidney disease with heart failure and with stage 5 chronic kidney disease, or end stage renal disease: Secondary | ICD-10-CM | POA: Diagnosis not present

## 2020-06-12 DIAGNOSIS — I252 Old myocardial infarction: Secondary | ICD-10-CM | POA: Diagnosis not present

## 2020-06-12 DIAGNOSIS — R7303 Prediabetes: Secondary | ICD-10-CM | POA: Diagnosis not present

## 2020-06-12 DIAGNOSIS — N184 Chronic kidney disease, stage 4 (severe): Secondary | ICD-10-CM | POA: Diagnosis not present

## 2020-06-12 DIAGNOSIS — Z01810 Encounter for preprocedural cardiovascular examination: Secondary | ICD-10-CM | POA: Diagnosis not present

## 2020-06-12 DIAGNOSIS — I509 Heart failure, unspecified: Secondary | ICD-10-CM | POA: Diagnosis not present

## 2020-06-12 DIAGNOSIS — N186 End stage renal disease: Secondary | ICD-10-CM | POA: Diagnosis not present

## 2020-06-15 DIAGNOSIS — Z992 Dependence on renal dialysis: Secondary | ICD-10-CM | POA: Diagnosis not present

## 2020-06-15 DIAGNOSIS — R7303 Prediabetes: Secondary | ICD-10-CM | POA: Diagnosis not present

## 2020-06-15 DIAGNOSIS — N186 End stage renal disease: Secondary | ICD-10-CM | POA: Diagnosis not present

## 2020-07-15 DIAGNOSIS — I770 Arteriovenous fistula, acquired: Secondary | ICD-10-CM | POA: Diagnosis not present

## 2020-07-15 DIAGNOSIS — N186 End stage renal disease: Secondary | ICD-10-CM | POA: Diagnosis not present

## 2020-07-23 DIAGNOSIS — E7849 Other hyperlipidemia: Secondary | ICD-10-CM | POA: Diagnosis not present

## 2020-07-23 DIAGNOSIS — Z1389 Encounter for screening for other disorder: Secondary | ICD-10-CM | POA: Diagnosis not present

## 2020-07-23 DIAGNOSIS — E119 Type 2 diabetes mellitus without complications: Secondary | ICD-10-CM | POA: Diagnosis not present

## 2020-07-23 DIAGNOSIS — Z0000 Encounter for general adult medical examination without abnormal findings: Secondary | ICD-10-CM | POA: Diagnosis not present

## 2020-07-23 DIAGNOSIS — L02411 Cutaneous abscess of right axilla: Secondary | ICD-10-CM | POA: Diagnosis not present

## 2020-07-23 DIAGNOSIS — I10 Essential (primary) hypertension: Secondary | ICD-10-CM | POA: Diagnosis not present

## 2020-07-23 DIAGNOSIS — M109 Gout, unspecified: Secondary | ICD-10-CM | POA: Diagnosis not present

## 2020-07-23 DIAGNOSIS — Z6836 Body mass index (BMI) 36.0-36.9, adult: Secondary | ICD-10-CM | POA: Diagnosis not present

## 2020-07-24 DIAGNOSIS — L02411 Cutaneous abscess of right axilla: Secondary | ICD-10-CM | POA: Diagnosis not present

## 2020-07-30 ENCOUNTER — Ambulatory Visit (INDEPENDENT_AMBULATORY_CARE_PROVIDER_SITE_OTHER): Payer: Medicare Other | Admitting: General Surgery

## 2020-07-30 ENCOUNTER — Other Ambulatory Visit: Payer: Self-pay

## 2020-07-30 ENCOUNTER — Encounter: Payer: Self-pay | Admitting: General Surgery

## 2020-07-30 VITALS — BP 169/84 | HR 99 | Temp 98.1°F | Resp 14 | Ht 66.0 in | Wt 213.0 lb

## 2020-07-30 DIAGNOSIS — L02411 Cutaneous abscess of right axilla: Secondary | ICD-10-CM

## 2020-07-30 MED ORDER — DOXYCYCLINE HYCLATE 100 MG PO TABS: 100.0000 mg | ORAL_TABLET | Freq: Two times a day (BID) | ORAL | 0 refills | Status: DC

## 2020-07-30 NOTE — Progress Notes (Signed)
Angel Davis; [id number]; [birth year]   HPI Patient is a 69 year old black female who was referred to my care by [name] for evaluation and treatment of an enlarged swelling and lymph node in the right axilla.  Patient initially noticed this several weeks ago.  She was seen by her primary care physician and an ultrasound was performed.  The mass could not be characterized by ultrasound and further imaging was recommended.  Unfortunately, the patient has renal disease with a creatinine of 4.45, so a contrasted CT could not be performed.  She was referred to my care for evaluation.  She was started on doxycycline and over the past 24 hours, started noticing drainage from her right axilla.  She is scheduled for a screening mammography later this year.  She does have a fistula which was created in the left arm for possible dialysis in the future.  She denies any fever or chills.  She is on Plavix. Past Medical History:  Diagnosis Date  . Abnormal EKG   . Acute gout 06/29/2014  . AKI (acute kidney injury) ([place]) 11/23/2018   12/16-12/31/2019 [place] : peak Creatinine 4.39  . Anemia    H&H of 10.3/33.5 in 12/2011  . Cerebral infarction [facility]) 11/23/2018   12/16-12/31/2019 [facility] MRI performed for encephalopathy in the setting of strep pneumoniae bacteremia and acute hypoxic respiratory failure with findings of multifocal subacute infarcts Plavix and high dose statin initiated  . Depression   . DEPRESSION 03/13/2008   Qualifier: Diagnosis of  By: [name], [place]    . Essential hypertension 03/13/2008   Lab: Normal CMet in 12/2011 except creatinine of 1.35   . FATIGUE 02/25/2009   Qualifier: Diagnosis of  By: Moshe Cipro MD, [name]    . Gout flare 04/28/2014  . HAND PAIN, RIGHT 02/25/2009   Qualifier: Diagnosis of  By: Moshe Cipro MD, [name]    . Hyperlipidemia   . Hypertension   . Metabolic syndrome X 1/96/2229  . Obesity   . OBESITY 03/13/2008   Qualifier: Diagnosis of  By: [name], [place]    . Pansinusitis  11/23/2018   12/16-12/31/2019 [facility] CT revealed complete opacification of bilateral maxillary/ethmoid sinus and right frontal sinus.Effusion within bilateral middle air spaces ENT recommended antibiotics w/o surgical intervention  . Pre-diabetes   . Prediabetes 01/08/2012   HBa1C is 6.4 in 04/2014   . Renal insufficiency 02/01/2012  . Respiratory failure with hypoxia ([place]) 11/23/2018   12/16-12/31/2009 [facility] with hypoxic respiratory failure in the context of strep pneumonia bacteremia (on blood cultures collected in [facility] ED) Intubated in [facility] ED and transferred to [facility]    . Tachycardia 04/28/2014  . Thrombocytosis ([place]) 11/25/2018   11/16/2018 platelet count 685,000  . Vitamin D deficiency   . Vitamin D deficiency 01/08/2012    Past Surgical History:  Procedure Laterality Date  . TOTAL VAGINAL HYSTERECTOMY      Family History  Problem Relation Age of Onset  . Heart attack Father   . Heart attack Mother   . Cancer Sister   . Hypertension Sister   . Hypertension Brother     Current Outpatient Medications on File Prior to Visit  Medication Sig Dispense Refill  . acetaminophen (TYLENOL) 500 MG tablet Take 1,000 mg by mouth every 6 (six) hours as needed.    Marland Kitchen atorvastatin (LIPITOR) 80 MG tablet Take 80 mg by mouth daily.    . carvedilol (COREG) 25 MG tablet Take 25 mg by mouth 2 (two) times daily with a meal.    .  clopidogrel (PLAVIX) 75 MG tablet Take 75 mg by mouth daily.    Marland Kitchen escitalopram (LEXAPRO) 10 MG tablet Take 10 mg by mouth daily.    . ferrous sulfate (KP FERROUS SULFATE) 325 (65 FE) MG tablet Take 325 mg by mouth 2 (two) times daily with a meal.    . NIFEdipine (PROCARDIA XL/ADALAT-CC) 90 MG 24 hr tablet Take 1 tablet (90 mg total) by mouth daily. 30 tablet 3  . cloNIDine (CATAPRES) 0.2 MG tablet Take 0.5 tablets (0.1 mg total) by mouth 2 (two) times daily. 60 tablet 2   No current facility-administered medications on file prior to visit.    No Known  Allergies  Social History   Substance and Sexual Activity  Alcohol Use No    Social History   Tobacco Use  Smoking Status Never Smoker  Smokeless Tobacco Never Used    Review of Systems  Constitutional: Negative.  Negative for chills and fever.  HENT: Negative.   Eyes: Negative.   Respiratory: Negative.   Cardiovascular: Negative.   Gastrointestinal: Negative.   Genitourinary: Negative.   Musculoskeletal: Positive for back pain, joint pain and neck pain.  Skin: Negative.   Neurological: Negative.   Endo/Heme/Allergies: Negative.   Psychiatric/Behavioral: Negative.     Objective   Vitals:   07/30/20 1020  BP: (!) 169/84  Pulse: 99  Resp: 14  Temp: 98.1 F (36.7 C)  SpO2: 94%    Physical Exam Vitals reviewed.  Constitutional:      Appearance: Normal appearance. She is obese. She is not ill-appearing.  HENT:     Head: Normocephalic and atraumatic.  Cardiovascular:     Rate and Rhythm: Normal rate and regular rhythm.     Heart sounds: Normal heart sounds. No murmur heard.  No friction rub. No gallop.   Pulmonary:     Effort: Pulmonary effort is normal. No respiratory distress.     Breath sounds: Normal breath sounds. No stridor. No wheezing, rhonchi or rales.  Skin:    General: Skin is dry.  Neurological:     Mental Status: She is alert and oriented to person, place, and time.   Breast/axilla: No dominant mass, nipple discharge, or dimpling in either breast.  Left axilla is negative for palpable nodes.  In the right axilla, 2 large areas of fluctuance are present with a small opening with purulent drainage present.  Using arrow freeze, the opening was lanced and purulent fluid was expressed.  Assessment of lymphadenopathy was limited due to the erythema and inflammation in that area.  Assessment  Right axillary abscess, resolving Plan   Patient was told to keep the area clean and dry with soap and water.  I have reordered her doxycycline for another 10  days.  She will follow-up in my office next week for a wound check.

## 2020-08-04 ENCOUNTER — Ambulatory Visit (INDEPENDENT_AMBULATORY_CARE_PROVIDER_SITE_OTHER): Payer: Medicare Other | Admitting: General Surgery

## 2020-08-04 ENCOUNTER — Ambulatory Visit: Payer: Medicare Other | Admitting: General Surgery

## 2020-08-04 ENCOUNTER — Encounter: Payer: Self-pay | Admitting: General Surgery

## 2020-08-04 ENCOUNTER — Other Ambulatory Visit: Payer: Self-pay

## 2020-08-04 VITALS — BP 142/80 | HR 82 | Temp 98.0°F | Resp 14 | Ht 66.0 in | Wt 214.0 lb

## 2020-08-04 DIAGNOSIS — L02411 Cutaneous abscess of right axilla: Secondary | ICD-10-CM

## 2020-08-04 NOTE — Progress Notes (Signed)
Subjective:     Angel Davis  Here for follow-up wound check of right axillary abscess.  Patient states it has significantly improved and gotten smaller.  She does still have some drainage.  She is continuing her antibiotics.  She is pleased with the results.  She denies any fevers. Objective:    BP (!) 142/80   Pulse 82   Temp 98 F (36.7 C) (Oral)   Resp 14   Ht 5\' 6"  (1.676 m)   Wt 214 lb (97.1 kg)   SpO2 93%   BMI 34.54 kg/m   General:  alert, cooperative and no distress  Right axilla with significant resolution of the induration and subcutaneous mass.  There is a linear induration that is present, but is much smaller than before.  Minimal drainage is noted.     Assessment:    Right axillary abscess, resolving    Plan:   Finish antibiotic course.  Keep wound clean and dry.  Follow-up in my office in 1 month for a wound check.

## 2020-08-10 DIAGNOSIS — Z79899 Other long term (current) drug therapy: Secondary | ICD-10-CM | POA: Diagnosis not present

## 2020-08-10 DIAGNOSIS — N271 Small kidney, bilateral: Secondary | ICD-10-CM | POA: Diagnosis not present

## 2020-08-10 DIAGNOSIS — E872 Acidosis: Secondary | ICD-10-CM | POA: Diagnosis not present

## 2020-08-10 DIAGNOSIS — I132 Hypertensive heart and chronic kidney disease with heart failure and with stage 5 chronic kidney disease, or end stage renal disease: Secondary | ICD-10-CM | POA: Diagnosis not present

## 2020-08-10 DIAGNOSIS — I5020 Unspecified systolic (congestive) heart failure: Secondary | ICD-10-CM | POA: Diagnosis not present

## 2020-08-10 DIAGNOSIS — N185 Chronic kidney disease, stage 5: Secondary | ICD-10-CM | POA: Diagnosis not present

## 2020-08-10 DIAGNOSIS — E1121 Type 2 diabetes mellitus with diabetic nephropathy: Secondary | ICD-10-CM | POA: Diagnosis not present

## 2020-08-10 DIAGNOSIS — E1122 Type 2 diabetes mellitus with diabetic chronic kidney disease: Secondary | ICD-10-CM | POA: Diagnosis not present

## 2020-08-10 DIAGNOSIS — R809 Proteinuria, unspecified: Secondary | ICD-10-CM | POA: Diagnosis not present

## 2020-08-10 DIAGNOSIS — D649 Anemia, unspecified: Secondary | ICD-10-CM | POA: Diagnosis not present

## 2020-08-13 DIAGNOSIS — H40033 Anatomical narrow angle, bilateral: Secondary | ICD-10-CM | POA: Diagnosis not present

## 2020-08-13 DIAGNOSIS — N185 Chronic kidney disease, stage 5: Secondary | ICD-10-CM | POA: Diagnosis not present

## 2020-08-13 DIAGNOSIS — H04123 Dry eye syndrome of bilateral lacrimal glands: Secondary | ICD-10-CM | POA: Diagnosis not present

## 2020-08-20 DIAGNOSIS — N184 Chronic kidney disease, stage 4 (severe): Secondary | ICD-10-CM | POA: Diagnosis not present

## 2020-09-03 ENCOUNTER — Ambulatory Visit: Payer: Medicare Other | Admitting: General Surgery

## 2020-09-10 ENCOUNTER — Ambulatory Visit (INDEPENDENT_AMBULATORY_CARE_PROVIDER_SITE_OTHER): Payer: Medicare Other | Admitting: General Surgery

## 2020-09-10 ENCOUNTER — Other Ambulatory Visit: Payer: Self-pay

## 2020-09-10 ENCOUNTER — Encounter: Payer: Self-pay | Admitting: General Surgery

## 2020-09-10 VITALS — BP 114/72 | HR 78 | Temp 97.7°F | Resp 14 | Ht 66.0 in | Wt 214.0 lb

## 2020-09-10 DIAGNOSIS — L02419 Cutaneous abscess of limb, unspecified: Secondary | ICD-10-CM | POA: Diagnosis not present

## 2020-09-10 NOTE — Progress Notes (Signed)
Subjective:     Angel Davis  here for follow-up wound check.  Patient states her right axillary abscess has resolved.  She has no pain present.  No drainage is noted. Objective:    BP 114/72   Pulse 78   Temp 97.7 F (36.5 C) (Oral)   Resp 14   Ht 5\' 6"  (1.676 m)   Wt 214 lb (97.1 kg)   SpO2 95%   BMI 34.54 kg/m   General:  alert, cooperative and no distress  right axillary abscess has resolved.  No induration or adenopathy is present.     Assessment:    Right axillary abscess, resolved    Plan:   Follow-up as needed.

## 2020-09-24 DIAGNOSIS — N184 Chronic kidney disease, stage 4 (severe): Secondary | ICD-10-CM | POA: Diagnosis not present

## 2020-10-23 DIAGNOSIS — I129 Hypertensive chronic kidney disease with stage 1 through stage 4 chronic kidney disease, or unspecified chronic kidney disease: Secondary | ICD-10-CM | POA: Diagnosis not present

## 2020-10-23 DIAGNOSIS — N185 Chronic kidney disease, stage 5: Secondary | ICD-10-CM | POA: Diagnosis not present

## 2020-10-23 DIAGNOSIS — I5023 Acute on chronic systolic (congestive) heart failure: Secondary | ICD-10-CM | POA: Diagnosis not present

## 2020-10-23 DIAGNOSIS — R110 Nausea: Secondary | ICD-10-CM | POA: Diagnosis not present

## 2020-10-23 DIAGNOSIS — N041 Nephrotic syndrome with focal and segmental glomerular lesions: Secondary | ICD-10-CM | POA: Diagnosis not present

## 2020-10-23 DIAGNOSIS — Z79899 Other long term (current) drug therapy: Secondary | ICD-10-CM | POA: Diagnosis not present

## 2020-10-23 DIAGNOSIS — I160 Hypertensive urgency: Secondary | ICD-10-CM | POA: Diagnosis not present

## 2020-10-23 DIAGNOSIS — D631 Anemia in chronic kidney disease: Secondary | ICD-10-CM | POA: Diagnosis not present

## 2020-10-23 DIAGNOSIS — E559 Vitamin D deficiency, unspecified: Secondary | ICD-10-CM | POA: Diagnosis not present

## 2020-10-23 DIAGNOSIS — I770 Arteriovenous fistula, acquired: Secondary | ICD-10-CM | POA: Diagnosis not present

## 2020-10-23 DIAGNOSIS — E211 Secondary hyperparathyroidism, not elsewhere classified: Secondary | ICD-10-CM | POA: Diagnosis not present

## 2020-10-23 DIAGNOSIS — Z1159 Encounter for screening for other viral diseases: Secondary | ICD-10-CM | POA: Diagnosis not present

## 2020-10-23 DIAGNOSIS — N189 Chronic kidney disease, unspecified: Secondary | ICD-10-CM | POA: Diagnosis not present

## 2020-10-23 DIAGNOSIS — R809 Proteinuria, unspecified: Secondary | ICD-10-CM | POA: Diagnosis not present

## 2020-11-03 ENCOUNTER — Ambulatory Visit (HOSPITAL_COMMUNITY)
Admission: RE | Admit: 2020-11-03 | Discharge: 2020-11-03 | Disposition: A | Discharge status: Home / Self Care | Payer: Medicare Other | Source: Ambulatory Visit | Attending: Nephrology | Admitting: Nephrology

## 2020-11-03 ENCOUNTER — Other Ambulatory Visit (HOSPITAL_COMMUNITY): Payer: Self-pay | Admitting: Nephrology

## 2020-11-03 ENCOUNTER — Other Ambulatory Visit: Payer: Self-pay

## 2020-11-03 DIAGNOSIS — Z111 Encounter for screening for respiratory tuberculosis: Secondary | ICD-10-CM | POA: Diagnosis not present

## 2020-11-03 DIAGNOSIS — I517 Cardiomegaly: Secondary | ICD-10-CM | POA: Diagnosis not present

## 2020-11-03 DIAGNOSIS — Z20822 Contact with and (suspected) exposure to covid-19: Secondary | ICD-10-CM | POA: Diagnosis not present

## 2020-11-03 DIAGNOSIS — J90 Pleural effusion, not elsewhere classified: Secondary | ICD-10-CM | POA: Diagnosis not present

## 2020-11-03 DIAGNOSIS — R0600 Dyspnea, unspecified: Secondary | ICD-10-CM | POA: Diagnosis not present

## 2020-11-03 DIAGNOSIS — N185 Chronic kidney disease, stage 5: Secondary | ICD-10-CM | POA: Diagnosis not present

## 2020-11-03 DIAGNOSIS — J811 Chronic pulmonary edema: Secondary | ICD-10-CM | POA: Diagnosis not present

## 2020-11-11 DIAGNOSIS — D631 Anemia in chronic kidney disease: Secondary | ICD-10-CM | POA: Diagnosis not present

## 2020-11-11 DIAGNOSIS — Z992 Dependence on renal dialysis: Secondary | ICD-10-CM | POA: Diagnosis not present

## 2020-11-11 DIAGNOSIS — N186 End stage renal disease: Secondary | ICD-10-CM | POA: Diagnosis not present

## 2020-11-13 DIAGNOSIS — Z992 Dependence on renal dialysis: Secondary | ICD-10-CM | POA: Diagnosis not present

## 2020-11-13 DIAGNOSIS — N186 End stage renal disease: Secondary | ICD-10-CM | POA: Diagnosis not present

## 2020-11-13 DIAGNOSIS — D631 Anemia in chronic kidney disease: Secondary | ICD-10-CM | POA: Diagnosis not present

## 2020-11-17 DIAGNOSIS — N186 End stage renal disease: Secondary | ICD-10-CM | POA: Diagnosis not present

## 2020-11-17 DIAGNOSIS — Z992 Dependence on renal dialysis: Secondary | ICD-10-CM | POA: Diagnosis not present

## 2020-11-17 DIAGNOSIS — Z4901 Encounter for fitting and adjustment of extracorporeal dialysis catheter: Secondary | ICD-10-CM | POA: Diagnosis not present

## 2020-11-18 DIAGNOSIS — Z992 Dependence on renal dialysis: Secondary | ICD-10-CM | POA: Diagnosis not present

## 2020-11-18 DIAGNOSIS — D631 Anemia in chronic kidney disease: Secondary | ICD-10-CM | POA: Diagnosis not present

## 2020-11-18 DIAGNOSIS — N186 End stage renal disease: Secondary | ICD-10-CM | POA: Diagnosis not present

## 2020-11-20 DIAGNOSIS — Z992 Dependence on renal dialysis: Secondary | ICD-10-CM | POA: Diagnosis not present

## 2020-11-20 DIAGNOSIS — N186 End stage renal disease: Secondary | ICD-10-CM | POA: Diagnosis not present

## 2020-11-20 DIAGNOSIS — D631 Anemia in chronic kidney disease: Secondary | ICD-10-CM | POA: Diagnosis not present

## 2020-11-25 DIAGNOSIS — Z992 Dependence on renal dialysis: Secondary | ICD-10-CM | POA: Diagnosis not present

## 2020-11-25 DIAGNOSIS — D631 Anemia in chronic kidney disease: Secondary | ICD-10-CM | POA: Diagnosis not present

## 2020-11-25 DIAGNOSIS — N2581 Secondary hyperparathyroidism of renal origin: Secondary | ICD-10-CM | POA: Diagnosis not present

## 2020-11-25 DIAGNOSIS — N186 End stage renal disease: Secondary | ICD-10-CM | POA: Diagnosis not present

## 2020-11-25 DIAGNOSIS — D509 Iron deficiency anemia, unspecified: Secondary | ICD-10-CM | POA: Diagnosis not present

## 2020-11-27 DIAGNOSIS — N186 End stage renal disease: Secondary | ICD-10-CM | POA: Diagnosis not present

## 2020-11-27 DIAGNOSIS — T8241XA Breakdown (mechanical) of vascular dialysis catheter, initial encounter: Secondary | ICD-10-CM | POA: Diagnosis not present

## 2020-11-27 DIAGNOSIS — T82868A Thrombosis of vascular prosthetic devices, implants and grafts, initial encounter: Secondary | ICD-10-CM | POA: Diagnosis not present

## 2020-11-27 DIAGNOSIS — Z4901 Encounter for fitting and adjustment of extracorporeal dialysis catheter: Secondary | ICD-10-CM | POA: Diagnosis not present

## 2020-11-28 DIAGNOSIS — Z992 Dependence on renal dialysis: Secondary | ICD-10-CM | POA: Diagnosis not present

## 2020-11-28 DIAGNOSIS — N2581 Secondary hyperparathyroidism of renal origin: Secondary | ICD-10-CM | POA: Diagnosis not present

## 2020-11-28 DIAGNOSIS — D509 Iron deficiency anemia, unspecified: Secondary | ICD-10-CM | POA: Diagnosis not present

## 2020-11-28 DIAGNOSIS — D631 Anemia in chronic kidney disease: Secondary | ICD-10-CM | POA: Diagnosis not present

## 2020-11-28 DIAGNOSIS — N186 End stage renal disease: Secondary | ICD-10-CM | POA: Diagnosis not present

## 2020-11-30 DIAGNOSIS — Z992 Dependence on renal dialysis: Secondary | ICD-10-CM | POA: Diagnosis not present

## 2020-11-30 DIAGNOSIS — N2581 Secondary hyperparathyroidism of renal origin: Secondary | ICD-10-CM | POA: Diagnosis not present

## 2020-11-30 DIAGNOSIS — D631 Anemia in chronic kidney disease: Secondary | ICD-10-CM | POA: Diagnosis not present

## 2020-11-30 DIAGNOSIS — N186 End stage renal disease: Secondary | ICD-10-CM | POA: Diagnosis not present

## 2020-11-30 DIAGNOSIS — D509 Iron deficiency anemia, unspecified: Secondary | ICD-10-CM | POA: Diagnosis not present

## 2020-12-03 DIAGNOSIS — D509 Iron deficiency anemia, unspecified: Secondary | ICD-10-CM | POA: Diagnosis not present

## 2020-12-03 DIAGNOSIS — D631 Anemia in chronic kidney disease: Secondary | ICD-10-CM | POA: Diagnosis not present

## 2020-12-03 DIAGNOSIS — N186 End stage renal disease: Secondary | ICD-10-CM | POA: Diagnosis not present

## 2020-12-03 DIAGNOSIS — N2581 Secondary hyperparathyroidism of renal origin: Secondary | ICD-10-CM | POA: Diagnosis not present

## 2020-12-03 DIAGNOSIS — Z992 Dependence on renal dialysis: Secondary | ICD-10-CM | POA: Diagnosis not present

## 2020-12-04 DIAGNOSIS — D509 Iron deficiency anemia, unspecified: Secondary | ICD-10-CM | POA: Diagnosis not present

## 2020-12-04 DIAGNOSIS — Z992 Dependence on renal dialysis: Secondary | ICD-10-CM | POA: Diagnosis not present

## 2020-12-04 DIAGNOSIS — N2581 Secondary hyperparathyroidism of renal origin: Secondary | ICD-10-CM | POA: Diagnosis not present

## 2020-12-04 DIAGNOSIS — D631 Anemia in chronic kidney disease: Secondary | ICD-10-CM | POA: Diagnosis not present

## 2020-12-04 DIAGNOSIS — N186 End stage renal disease: Secondary | ICD-10-CM | POA: Diagnosis not present

## 2020-12-07 DIAGNOSIS — Z992 Dependence on renal dialysis: Secondary | ICD-10-CM | POA: Diagnosis not present

## 2020-12-07 DIAGNOSIS — D631 Anemia in chronic kidney disease: Secondary | ICD-10-CM | POA: Diagnosis not present

## 2020-12-07 DIAGNOSIS — D509 Iron deficiency anemia, unspecified: Secondary | ICD-10-CM | POA: Diagnosis not present

## 2020-12-07 DIAGNOSIS — N2581 Secondary hyperparathyroidism of renal origin: Secondary | ICD-10-CM | POA: Diagnosis not present

## 2020-12-07 DIAGNOSIS — N186 End stage renal disease: Secondary | ICD-10-CM | POA: Diagnosis not present

## 2020-12-09 DIAGNOSIS — D631 Anemia in chronic kidney disease: Secondary | ICD-10-CM | POA: Diagnosis not present

## 2020-12-09 DIAGNOSIS — D509 Iron deficiency anemia, unspecified: Secondary | ICD-10-CM | POA: Diagnosis not present

## 2020-12-09 DIAGNOSIS — N2581 Secondary hyperparathyroidism of renal origin: Secondary | ICD-10-CM | POA: Diagnosis not present

## 2020-12-09 DIAGNOSIS — Z992 Dependence on renal dialysis: Secondary | ICD-10-CM | POA: Diagnosis not present

## 2020-12-09 DIAGNOSIS — N186 End stage renal disease: Secondary | ICD-10-CM | POA: Diagnosis not present

## 2020-12-11 DIAGNOSIS — D509 Iron deficiency anemia, unspecified: Secondary | ICD-10-CM | POA: Diagnosis not present

## 2020-12-11 DIAGNOSIS — Z992 Dependence on renal dialysis: Secondary | ICD-10-CM | POA: Diagnosis not present

## 2020-12-11 DIAGNOSIS — D631 Anemia in chronic kidney disease: Secondary | ICD-10-CM | POA: Diagnosis not present

## 2020-12-11 DIAGNOSIS — N2581 Secondary hyperparathyroidism of renal origin: Secondary | ICD-10-CM | POA: Diagnosis not present

## 2020-12-11 DIAGNOSIS — N186 End stage renal disease: Secondary | ICD-10-CM | POA: Diagnosis not present

## 2020-12-14 DIAGNOSIS — D631 Anemia in chronic kidney disease: Secondary | ICD-10-CM | POA: Diagnosis not present

## 2020-12-14 DIAGNOSIS — Z992 Dependence on renal dialysis: Secondary | ICD-10-CM | POA: Diagnosis not present

## 2020-12-14 DIAGNOSIS — D509 Iron deficiency anemia, unspecified: Secondary | ICD-10-CM | POA: Diagnosis not present

## 2020-12-14 DIAGNOSIS — N2581 Secondary hyperparathyroidism of renal origin: Secondary | ICD-10-CM | POA: Diagnosis not present

## 2020-12-14 DIAGNOSIS — N186 End stage renal disease: Secondary | ICD-10-CM | POA: Diagnosis not present

## 2020-12-16 DIAGNOSIS — N186 End stage renal disease: Secondary | ICD-10-CM | POA: Diagnosis not present

## 2020-12-16 DIAGNOSIS — N2581 Secondary hyperparathyroidism of renal origin: Secondary | ICD-10-CM | POA: Diagnosis not present

## 2020-12-16 DIAGNOSIS — D509 Iron deficiency anemia, unspecified: Secondary | ICD-10-CM | POA: Diagnosis not present

## 2020-12-16 DIAGNOSIS — D631 Anemia in chronic kidney disease: Secondary | ICD-10-CM | POA: Diagnosis not present

## 2020-12-16 DIAGNOSIS — Z992 Dependence on renal dialysis: Secondary | ICD-10-CM | POA: Diagnosis not present

## 2020-12-18 DIAGNOSIS — N2581 Secondary hyperparathyroidism of renal origin: Secondary | ICD-10-CM | POA: Diagnosis not present

## 2020-12-18 DIAGNOSIS — D509 Iron deficiency anemia, unspecified: Secondary | ICD-10-CM | POA: Diagnosis not present

## 2020-12-18 DIAGNOSIS — Z992 Dependence on renal dialysis: Secondary | ICD-10-CM | POA: Diagnosis not present

## 2020-12-18 DIAGNOSIS — D631 Anemia in chronic kidney disease: Secondary | ICD-10-CM | POA: Diagnosis not present

## 2020-12-18 DIAGNOSIS — N186 End stage renal disease: Secondary | ICD-10-CM | POA: Diagnosis not present

## 2020-12-21 DIAGNOSIS — D631 Anemia in chronic kidney disease: Secondary | ICD-10-CM | POA: Diagnosis not present

## 2020-12-21 DIAGNOSIS — D509 Iron deficiency anemia, unspecified: Secondary | ICD-10-CM | POA: Diagnosis not present

## 2020-12-21 DIAGNOSIS — Z992 Dependence on renal dialysis: Secondary | ICD-10-CM | POA: Diagnosis not present

## 2020-12-21 DIAGNOSIS — N2581 Secondary hyperparathyroidism of renal origin: Secondary | ICD-10-CM | POA: Diagnosis not present

## 2020-12-21 DIAGNOSIS — N186 End stage renal disease: Secondary | ICD-10-CM | POA: Diagnosis not present

## 2020-12-23 DIAGNOSIS — N2581 Secondary hyperparathyroidism of renal origin: Secondary | ICD-10-CM | POA: Diagnosis not present

## 2020-12-23 DIAGNOSIS — D509 Iron deficiency anemia, unspecified: Secondary | ICD-10-CM | POA: Diagnosis not present

## 2020-12-23 DIAGNOSIS — N186 End stage renal disease: Secondary | ICD-10-CM | POA: Diagnosis not present

## 2020-12-23 DIAGNOSIS — Z992 Dependence on renal dialysis: Secondary | ICD-10-CM | POA: Diagnosis not present

## 2020-12-23 DIAGNOSIS — D631 Anemia in chronic kidney disease: Secondary | ICD-10-CM | POA: Diagnosis not present

## 2020-12-25 DIAGNOSIS — D509 Iron deficiency anemia, unspecified: Secondary | ICD-10-CM | POA: Diagnosis not present

## 2020-12-25 DIAGNOSIS — Z992 Dependence on renal dialysis: Secondary | ICD-10-CM | POA: Diagnosis not present

## 2020-12-25 DIAGNOSIS — N2581 Secondary hyperparathyroidism of renal origin: Secondary | ICD-10-CM | POA: Diagnosis not present

## 2020-12-25 DIAGNOSIS — N186 End stage renal disease: Secondary | ICD-10-CM | POA: Diagnosis not present

## 2020-12-25 DIAGNOSIS — D631 Anemia in chronic kidney disease: Secondary | ICD-10-CM | POA: Diagnosis not present

## 2020-12-29 DIAGNOSIS — D509 Iron deficiency anemia, unspecified: Secondary | ICD-10-CM | POA: Diagnosis not present

## 2020-12-29 DIAGNOSIS — N186 End stage renal disease: Secondary | ICD-10-CM | POA: Diagnosis not present

## 2020-12-29 DIAGNOSIS — D631 Anemia in chronic kidney disease: Secondary | ICD-10-CM | POA: Diagnosis not present

## 2020-12-29 DIAGNOSIS — Z992 Dependence on renal dialysis: Secondary | ICD-10-CM | POA: Diagnosis not present

## 2020-12-29 DIAGNOSIS — N2581 Secondary hyperparathyroidism of renal origin: Secondary | ICD-10-CM | POA: Diagnosis not present

## 2020-12-30 DIAGNOSIS — Z992 Dependence on renal dialysis: Secondary | ICD-10-CM | POA: Diagnosis not present

## 2020-12-30 DIAGNOSIS — N186 End stage renal disease: Secondary | ICD-10-CM | POA: Diagnosis not present

## 2020-12-30 DIAGNOSIS — Z01810 Encounter for preprocedural cardiovascular examination: Secondary | ICD-10-CM | POA: Diagnosis not present

## 2020-12-31 DIAGNOSIS — D509 Iron deficiency anemia, unspecified: Secondary | ICD-10-CM | POA: Diagnosis not present

## 2020-12-31 DIAGNOSIS — N186 End stage renal disease: Secondary | ICD-10-CM | POA: Diagnosis not present

## 2020-12-31 DIAGNOSIS — N2581 Secondary hyperparathyroidism of renal origin: Secondary | ICD-10-CM | POA: Diagnosis not present

## 2020-12-31 DIAGNOSIS — D631 Anemia in chronic kidney disease: Secondary | ICD-10-CM | POA: Diagnosis not present

## 2020-12-31 DIAGNOSIS — Z992 Dependence on renal dialysis: Secondary | ICD-10-CM | POA: Diagnosis not present

## 2021-01-01 DIAGNOSIS — D631 Anemia in chronic kidney disease: Secondary | ICD-10-CM | POA: Diagnosis not present

## 2021-01-01 DIAGNOSIS — N2581 Secondary hyperparathyroidism of renal origin: Secondary | ICD-10-CM | POA: Diagnosis not present

## 2021-01-01 DIAGNOSIS — D509 Iron deficiency anemia, unspecified: Secondary | ICD-10-CM | POA: Diagnosis not present

## 2021-01-01 DIAGNOSIS — N186 End stage renal disease: Secondary | ICD-10-CM | POA: Diagnosis not present

## 2021-01-01 DIAGNOSIS — Z992 Dependence on renal dialysis: Secondary | ICD-10-CM | POA: Diagnosis not present

## 2021-01-04 DIAGNOSIS — N2581 Secondary hyperparathyroidism of renal origin: Secondary | ICD-10-CM | POA: Diagnosis not present

## 2021-01-04 DIAGNOSIS — Z992 Dependence on renal dialysis: Secondary | ICD-10-CM | POA: Diagnosis not present

## 2021-01-04 DIAGNOSIS — D509 Iron deficiency anemia, unspecified: Secondary | ICD-10-CM | POA: Diagnosis not present

## 2021-01-04 DIAGNOSIS — N186 End stage renal disease: Secondary | ICD-10-CM | POA: Diagnosis not present

## 2021-01-04 DIAGNOSIS — D631 Anemia in chronic kidney disease: Secondary | ICD-10-CM | POA: Diagnosis not present

## 2021-01-06 DIAGNOSIS — D509 Iron deficiency anemia, unspecified: Secondary | ICD-10-CM | POA: Diagnosis not present

## 2021-01-06 DIAGNOSIS — N2581 Secondary hyperparathyroidism of renal origin: Secondary | ICD-10-CM | POA: Diagnosis not present

## 2021-01-06 DIAGNOSIS — D631 Anemia in chronic kidney disease: Secondary | ICD-10-CM | POA: Diagnosis not present

## 2021-01-06 DIAGNOSIS — N186 End stage renal disease: Secondary | ICD-10-CM | POA: Diagnosis not present

## 2021-01-06 DIAGNOSIS — Z992 Dependence on renal dialysis: Secondary | ICD-10-CM | POA: Diagnosis not present

## 2021-01-08 DIAGNOSIS — D631 Anemia in chronic kidney disease: Secondary | ICD-10-CM | POA: Diagnosis not present

## 2021-01-08 DIAGNOSIS — D509 Iron deficiency anemia, unspecified: Secondary | ICD-10-CM | POA: Diagnosis not present

## 2021-01-08 DIAGNOSIS — Z992 Dependence on renal dialysis: Secondary | ICD-10-CM | POA: Diagnosis not present

## 2021-01-08 DIAGNOSIS — N2581 Secondary hyperparathyroidism of renal origin: Secondary | ICD-10-CM | POA: Diagnosis not present

## 2021-01-08 DIAGNOSIS — N186 End stage renal disease: Secondary | ICD-10-CM | POA: Diagnosis not present

## 2021-01-11 DIAGNOSIS — Z992 Dependence on renal dialysis: Secondary | ICD-10-CM | POA: Diagnosis not present

## 2021-01-11 DIAGNOSIS — N186 End stage renal disease: Secondary | ICD-10-CM | POA: Diagnosis not present

## 2021-01-11 DIAGNOSIS — D631 Anemia in chronic kidney disease: Secondary | ICD-10-CM | POA: Diagnosis not present

## 2021-01-11 DIAGNOSIS — D509 Iron deficiency anemia, unspecified: Secondary | ICD-10-CM | POA: Diagnosis not present

## 2021-01-11 DIAGNOSIS — N2581 Secondary hyperparathyroidism of renal origin: Secondary | ICD-10-CM | POA: Diagnosis not present

## 2021-01-13 DIAGNOSIS — N2581 Secondary hyperparathyroidism of renal origin: Secondary | ICD-10-CM | POA: Diagnosis not present

## 2021-01-13 DIAGNOSIS — N186 End stage renal disease: Secondary | ICD-10-CM | POA: Diagnosis not present

## 2021-01-13 DIAGNOSIS — D509 Iron deficiency anemia, unspecified: Secondary | ICD-10-CM | POA: Diagnosis not present

## 2021-01-13 DIAGNOSIS — Z992 Dependence on renal dialysis: Secondary | ICD-10-CM | POA: Diagnosis not present

## 2021-01-13 DIAGNOSIS — D631 Anemia in chronic kidney disease: Secondary | ICD-10-CM | POA: Diagnosis not present

## 2021-01-15 DIAGNOSIS — N186 End stage renal disease: Secondary | ICD-10-CM | POA: Diagnosis not present

## 2021-01-15 DIAGNOSIS — D509 Iron deficiency anemia, unspecified: Secondary | ICD-10-CM | POA: Diagnosis not present

## 2021-01-15 DIAGNOSIS — D631 Anemia in chronic kidney disease: Secondary | ICD-10-CM | POA: Diagnosis not present

## 2021-01-15 DIAGNOSIS — Z992 Dependence on renal dialysis: Secondary | ICD-10-CM | POA: Diagnosis not present

## 2021-01-15 DIAGNOSIS — N2581 Secondary hyperparathyroidism of renal origin: Secondary | ICD-10-CM | POA: Diagnosis not present

## 2021-01-18 DIAGNOSIS — D509 Iron deficiency anemia, unspecified: Secondary | ICD-10-CM | POA: Diagnosis not present

## 2021-01-18 DIAGNOSIS — N186 End stage renal disease: Secondary | ICD-10-CM | POA: Diagnosis not present

## 2021-01-18 DIAGNOSIS — D631 Anemia in chronic kidney disease: Secondary | ICD-10-CM | POA: Diagnosis not present

## 2021-01-18 DIAGNOSIS — N2581 Secondary hyperparathyroidism of renal origin: Secondary | ICD-10-CM | POA: Diagnosis not present

## 2021-01-18 DIAGNOSIS — Z992 Dependence on renal dialysis: Secondary | ICD-10-CM | POA: Diagnosis not present

## 2021-01-20 DIAGNOSIS — Z992 Dependence on renal dialysis: Secondary | ICD-10-CM | POA: Diagnosis not present

## 2021-01-20 DIAGNOSIS — D631 Anemia in chronic kidney disease: Secondary | ICD-10-CM | POA: Diagnosis not present

## 2021-01-20 DIAGNOSIS — Z23 Encounter for immunization: Secondary | ICD-10-CM | POA: Diagnosis not present

## 2021-01-20 DIAGNOSIS — N186 End stage renal disease: Secondary | ICD-10-CM | POA: Diagnosis not present

## 2021-01-20 DIAGNOSIS — D509 Iron deficiency anemia, unspecified: Secondary | ICD-10-CM | POA: Diagnosis not present

## 2021-01-20 DIAGNOSIS — N2581 Secondary hyperparathyroidism of renal origin: Secondary | ICD-10-CM | POA: Diagnosis not present

## 2021-01-22 DIAGNOSIS — N186 End stage renal disease: Secondary | ICD-10-CM | POA: Diagnosis not present

## 2021-01-22 DIAGNOSIS — D509 Iron deficiency anemia, unspecified: Secondary | ICD-10-CM | POA: Diagnosis not present

## 2021-01-22 DIAGNOSIS — Z992 Dependence on renal dialysis: Secondary | ICD-10-CM | POA: Diagnosis not present

## 2021-01-22 DIAGNOSIS — N2581 Secondary hyperparathyroidism of renal origin: Secondary | ICD-10-CM | POA: Diagnosis not present

## 2021-01-22 DIAGNOSIS — Z23 Encounter for immunization: Secondary | ICD-10-CM | POA: Diagnosis not present

## 2021-01-22 DIAGNOSIS — D631 Anemia in chronic kidney disease: Secondary | ICD-10-CM | POA: Diagnosis not present

## 2021-01-25 DIAGNOSIS — Z992 Dependence on renal dialysis: Secondary | ICD-10-CM | POA: Diagnosis not present

## 2021-01-25 DIAGNOSIS — N2581 Secondary hyperparathyroidism of renal origin: Secondary | ICD-10-CM | POA: Diagnosis not present

## 2021-01-25 DIAGNOSIS — D509 Iron deficiency anemia, unspecified: Secondary | ICD-10-CM | POA: Diagnosis not present

## 2021-01-25 DIAGNOSIS — N186 End stage renal disease: Secondary | ICD-10-CM | POA: Diagnosis not present

## 2021-01-25 DIAGNOSIS — D631 Anemia in chronic kidney disease: Secondary | ICD-10-CM | POA: Diagnosis not present

## 2021-01-25 DIAGNOSIS — Z23 Encounter for immunization: Secondary | ICD-10-CM | POA: Diagnosis not present

## 2021-01-27 DIAGNOSIS — Z992 Dependence on renal dialysis: Secondary | ICD-10-CM | POA: Diagnosis not present

## 2021-01-27 DIAGNOSIS — Z23 Encounter for immunization: Secondary | ICD-10-CM | POA: Diagnosis not present

## 2021-01-27 DIAGNOSIS — N2581 Secondary hyperparathyroidism of renal origin: Secondary | ICD-10-CM | POA: Diagnosis not present

## 2021-01-27 DIAGNOSIS — N186 End stage renal disease: Secondary | ICD-10-CM | POA: Diagnosis not present

## 2021-01-27 DIAGNOSIS — D509 Iron deficiency anemia, unspecified: Secondary | ICD-10-CM | POA: Diagnosis not present

## 2021-01-27 DIAGNOSIS — D631 Anemia in chronic kidney disease: Secondary | ICD-10-CM | POA: Diagnosis not present

## 2021-01-29 DIAGNOSIS — Z23 Encounter for immunization: Secondary | ICD-10-CM | POA: Diagnosis not present

## 2021-01-29 DIAGNOSIS — N2581 Secondary hyperparathyroidism of renal origin: Secondary | ICD-10-CM | POA: Diagnosis not present

## 2021-01-29 DIAGNOSIS — Z992 Dependence on renal dialysis: Secondary | ICD-10-CM | POA: Diagnosis not present

## 2021-01-29 DIAGNOSIS — D631 Anemia in chronic kidney disease: Secondary | ICD-10-CM | POA: Diagnosis not present

## 2021-01-29 DIAGNOSIS — D509 Iron deficiency anemia, unspecified: Secondary | ICD-10-CM | POA: Diagnosis not present

## 2021-01-29 DIAGNOSIS — N186 End stage renal disease: Secondary | ICD-10-CM | POA: Diagnosis not present

## 2021-02-01 DIAGNOSIS — Z992 Dependence on renal dialysis: Secondary | ICD-10-CM | POA: Diagnosis not present

## 2021-02-01 DIAGNOSIS — D631 Anemia in chronic kidney disease: Secondary | ICD-10-CM | POA: Diagnosis not present

## 2021-02-01 DIAGNOSIS — D509 Iron deficiency anemia, unspecified: Secondary | ICD-10-CM | POA: Diagnosis not present

## 2021-02-01 DIAGNOSIS — N186 End stage renal disease: Secondary | ICD-10-CM | POA: Diagnosis not present

## 2021-02-01 DIAGNOSIS — Z23 Encounter for immunization: Secondary | ICD-10-CM | POA: Diagnosis not present

## 2021-02-01 DIAGNOSIS — N2581 Secondary hyperparathyroidism of renal origin: Secondary | ICD-10-CM | POA: Diagnosis not present

## 2021-02-03 DIAGNOSIS — Z992 Dependence on renal dialysis: Secondary | ICD-10-CM | POA: Diagnosis not present

## 2021-02-03 DIAGNOSIS — N186 End stage renal disease: Secondary | ICD-10-CM | POA: Diagnosis not present

## 2021-02-03 DIAGNOSIS — D631 Anemia in chronic kidney disease: Secondary | ICD-10-CM | POA: Diagnosis not present

## 2021-02-03 DIAGNOSIS — N2581 Secondary hyperparathyroidism of renal origin: Secondary | ICD-10-CM | POA: Diagnosis not present

## 2021-02-03 DIAGNOSIS — Z23 Encounter for immunization: Secondary | ICD-10-CM | POA: Diagnosis not present

## 2021-02-03 DIAGNOSIS — D509 Iron deficiency anemia, unspecified: Secondary | ICD-10-CM | POA: Diagnosis not present

## 2021-02-05 DIAGNOSIS — Z992 Dependence on renal dialysis: Secondary | ICD-10-CM | POA: Diagnosis not present

## 2021-02-05 DIAGNOSIS — D631 Anemia in chronic kidney disease: Secondary | ICD-10-CM | POA: Diagnosis not present

## 2021-02-05 DIAGNOSIS — Z23 Encounter for immunization: Secondary | ICD-10-CM | POA: Diagnosis not present

## 2021-02-05 DIAGNOSIS — N186 End stage renal disease: Secondary | ICD-10-CM | POA: Diagnosis not present

## 2021-02-05 DIAGNOSIS — D509 Iron deficiency anemia, unspecified: Secondary | ICD-10-CM | POA: Diagnosis not present

## 2021-02-05 DIAGNOSIS — N2581 Secondary hyperparathyroidism of renal origin: Secondary | ICD-10-CM | POA: Diagnosis not present

## 2021-02-08 DIAGNOSIS — D631 Anemia in chronic kidney disease: Secondary | ICD-10-CM | POA: Diagnosis not present

## 2021-02-08 DIAGNOSIS — N186 End stage renal disease: Secondary | ICD-10-CM | POA: Diagnosis not present

## 2021-02-08 DIAGNOSIS — Z23 Encounter for immunization: Secondary | ICD-10-CM | POA: Diagnosis not present

## 2021-02-08 DIAGNOSIS — N2581 Secondary hyperparathyroidism of renal origin: Secondary | ICD-10-CM | POA: Diagnosis not present

## 2021-02-08 DIAGNOSIS — D509 Iron deficiency anemia, unspecified: Secondary | ICD-10-CM | POA: Diagnosis not present

## 2021-02-08 DIAGNOSIS — Z992 Dependence on renal dialysis: Secondary | ICD-10-CM | POA: Diagnosis not present

## 2021-02-10 DIAGNOSIS — D509 Iron deficiency anemia, unspecified: Secondary | ICD-10-CM | POA: Diagnosis not present

## 2021-02-10 DIAGNOSIS — Z23 Encounter for immunization: Secondary | ICD-10-CM | POA: Diagnosis not present

## 2021-02-10 DIAGNOSIS — N2581 Secondary hyperparathyroidism of renal origin: Secondary | ICD-10-CM | POA: Diagnosis not present

## 2021-02-10 DIAGNOSIS — D631 Anemia in chronic kidney disease: Secondary | ICD-10-CM | POA: Diagnosis not present

## 2021-02-10 DIAGNOSIS — N186 End stage renal disease: Secondary | ICD-10-CM | POA: Diagnosis not present

## 2021-02-10 DIAGNOSIS — Z992 Dependence on renal dialysis: Secondary | ICD-10-CM | POA: Diagnosis not present

## 2021-02-12 DIAGNOSIS — N2581 Secondary hyperparathyroidism of renal origin: Secondary | ICD-10-CM | POA: Diagnosis not present

## 2021-02-12 DIAGNOSIS — N186 End stage renal disease: Secondary | ICD-10-CM | POA: Diagnosis not present

## 2021-02-12 DIAGNOSIS — D509 Iron deficiency anemia, unspecified: Secondary | ICD-10-CM | POA: Diagnosis not present

## 2021-02-12 DIAGNOSIS — Z992 Dependence on renal dialysis: Secondary | ICD-10-CM | POA: Diagnosis not present

## 2021-02-12 DIAGNOSIS — Z23 Encounter for immunization: Secondary | ICD-10-CM | POA: Diagnosis not present

## 2021-02-12 DIAGNOSIS — D631 Anemia in chronic kidney disease: Secondary | ICD-10-CM | POA: Diagnosis not present

## 2021-02-15 DIAGNOSIS — N186 End stage renal disease: Secondary | ICD-10-CM | POA: Diagnosis not present

## 2021-02-15 DIAGNOSIS — D631 Anemia in chronic kidney disease: Secondary | ICD-10-CM | POA: Diagnosis not present

## 2021-02-15 DIAGNOSIS — Z23 Encounter for immunization: Secondary | ICD-10-CM | POA: Diagnosis not present

## 2021-02-15 DIAGNOSIS — D509 Iron deficiency anemia, unspecified: Secondary | ICD-10-CM | POA: Diagnosis not present

## 2021-02-15 DIAGNOSIS — N2581 Secondary hyperparathyroidism of renal origin: Secondary | ICD-10-CM | POA: Diagnosis not present

## 2021-02-15 DIAGNOSIS — Z992 Dependence on renal dialysis: Secondary | ICD-10-CM | POA: Diagnosis not present

## 2021-02-16 DIAGNOSIS — N186 End stage renal disease: Secondary | ICD-10-CM | POA: Diagnosis not present

## 2021-02-16 DIAGNOSIS — I871 Compression of vein: Secondary | ICD-10-CM | POA: Diagnosis not present

## 2021-02-16 DIAGNOSIS — Z992 Dependence on renal dialysis: Secondary | ICD-10-CM | POA: Diagnosis not present

## 2021-02-18 DIAGNOSIS — Z23 Encounter for immunization: Secondary | ICD-10-CM | POA: Diagnosis not present

## 2021-02-18 DIAGNOSIS — N186 End stage renal disease: Secondary | ICD-10-CM | POA: Diagnosis not present

## 2021-02-18 DIAGNOSIS — D631 Anemia in chronic kidney disease: Secondary | ICD-10-CM | POA: Diagnosis not present

## 2021-02-18 DIAGNOSIS — N2581 Secondary hyperparathyroidism of renal origin: Secondary | ICD-10-CM | POA: Diagnosis not present

## 2021-02-18 DIAGNOSIS — Z992 Dependence on renal dialysis: Secondary | ICD-10-CM | POA: Diagnosis not present

## 2021-02-18 DIAGNOSIS — D509 Iron deficiency anemia, unspecified: Secondary | ICD-10-CM | POA: Diagnosis not present

## 2021-02-19 DIAGNOSIS — N186 End stage renal disease: Secondary | ICD-10-CM | POA: Diagnosis not present

## 2021-02-19 DIAGNOSIS — N2581 Secondary hyperparathyroidism of renal origin: Secondary | ICD-10-CM | POA: Diagnosis not present

## 2021-02-19 DIAGNOSIS — D509 Iron deficiency anemia, unspecified: Secondary | ICD-10-CM | POA: Diagnosis not present

## 2021-02-19 DIAGNOSIS — Z992 Dependence on renal dialysis: Secondary | ICD-10-CM | POA: Diagnosis not present

## 2021-02-19 DIAGNOSIS — D631 Anemia in chronic kidney disease: Secondary | ICD-10-CM | POA: Diagnosis not present

## 2021-02-19 DIAGNOSIS — Z23 Encounter for immunization: Secondary | ICD-10-CM | POA: Diagnosis not present

## 2021-02-22 DIAGNOSIS — N2581 Secondary hyperparathyroidism of renal origin: Secondary | ICD-10-CM | POA: Diagnosis not present

## 2021-02-22 DIAGNOSIS — D509 Iron deficiency anemia, unspecified: Secondary | ICD-10-CM | POA: Diagnosis not present

## 2021-02-22 DIAGNOSIS — Z992 Dependence on renal dialysis: Secondary | ICD-10-CM | POA: Diagnosis not present

## 2021-02-22 DIAGNOSIS — N186 End stage renal disease: Secondary | ICD-10-CM | POA: Diagnosis not present

## 2021-02-22 DIAGNOSIS — Z23 Encounter for immunization: Secondary | ICD-10-CM | POA: Diagnosis not present

## 2021-02-22 DIAGNOSIS — D631 Anemia in chronic kidney disease: Secondary | ICD-10-CM | POA: Diagnosis not present

## 2021-02-24 DIAGNOSIS — Z23 Encounter for immunization: Secondary | ICD-10-CM | POA: Diagnosis not present

## 2021-02-24 DIAGNOSIS — D509 Iron deficiency anemia, unspecified: Secondary | ICD-10-CM | POA: Diagnosis not present

## 2021-02-24 DIAGNOSIS — N186 End stage renal disease: Secondary | ICD-10-CM | POA: Diagnosis not present

## 2021-02-24 DIAGNOSIS — D631 Anemia in chronic kidney disease: Secondary | ICD-10-CM | POA: Diagnosis not present

## 2021-02-24 DIAGNOSIS — Z992 Dependence on renal dialysis: Secondary | ICD-10-CM | POA: Diagnosis not present

## 2021-02-24 DIAGNOSIS — N2581 Secondary hyperparathyroidism of renal origin: Secondary | ICD-10-CM | POA: Diagnosis not present

## 2021-02-26 DIAGNOSIS — N2581 Secondary hyperparathyroidism of renal origin: Secondary | ICD-10-CM | POA: Diagnosis not present

## 2021-02-26 DIAGNOSIS — Z992 Dependence on renal dialysis: Secondary | ICD-10-CM | POA: Diagnosis not present

## 2021-02-26 DIAGNOSIS — N186 End stage renal disease: Secondary | ICD-10-CM | POA: Diagnosis not present

## 2021-02-26 DIAGNOSIS — D509 Iron deficiency anemia, unspecified: Secondary | ICD-10-CM | POA: Diagnosis not present

## 2021-02-26 DIAGNOSIS — Z23 Encounter for immunization: Secondary | ICD-10-CM | POA: Diagnosis not present

## 2021-02-26 DIAGNOSIS — D631 Anemia in chronic kidney disease: Secondary | ICD-10-CM | POA: Diagnosis not present

## 2021-03-01 DIAGNOSIS — N2581 Secondary hyperparathyroidism of renal origin: Secondary | ICD-10-CM | POA: Diagnosis not present

## 2021-03-01 DIAGNOSIS — N186 End stage renal disease: Secondary | ICD-10-CM | POA: Diagnosis not present

## 2021-03-01 DIAGNOSIS — Z992 Dependence on renal dialysis: Secondary | ICD-10-CM | POA: Diagnosis not present

## 2021-03-01 DIAGNOSIS — Z23 Encounter for immunization: Secondary | ICD-10-CM | POA: Diagnosis not present

## 2021-03-01 DIAGNOSIS — D509 Iron deficiency anemia, unspecified: Secondary | ICD-10-CM | POA: Diagnosis not present

## 2021-03-01 DIAGNOSIS — D631 Anemia in chronic kidney disease: Secondary | ICD-10-CM | POA: Diagnosis not present

## 2021-03-03 DIAGNOSIS — Z992 Dependence on renal dialysis: Secondary | ICD-10-CM | POA: Diagnosis not present

## 2021-03-03 DIAGNOSIS — N2581 Secondary hyperparathyroidism of renal origin: Secondary | ICD-10-CM | POA: Diagnosis not present

## 2021-03-03 DIAGNOSIS — Z23 Encounter for immunization: Secondary | ICD-10-CM | POA: Diagnosis not present

## 2021-03-03 DIAGNOSIS — D631 Anemia in chronic kidney disease: Secondary | ICD-10-CM | POA: Diagnosis not present

## 2021-03-03 DIAGNOSIS — D509 Iron deficiency anemia, unspecified: Secondary | ICD-10-CM | POA: Diagnosis not present

## 2021-03-03 DIAGNOSIS — N186 End stage renal disease: Secondary | ICD-10-CM | POA: Diagnosis not present

## 2021-03-05 DIAGNOSIS — N2581 Secondary hyperparathyroidism of renal origin: Secondary | ICD-10-CM | POA: Diagnosis not present

## 2021-03-05 DIAGNOSIS — D631 Anemia in chronic kidney disease: Secondary | ICD-10-CM | POA: Diagnosis not present

## 2021-03-05 DIAGNOSIS — Z992 Dependence on renal dialysis: Secondary | ICD-10-CM | POA: Diagnosis not present

## 2021-03-05 DIAGNOSIS — N186 End stage renal disease: Secondary | ICD-10-CM | POA: Diagnosis not present

## 2021-03-05 DIAGNOSIS — Z23 Encounter for immunization: Secondary | ICD-10-CM | POA: Diagnosis not present

## 2021-03-05 DIAGNOSIS — D509 Iron deficiency anemia, unspecified: Secondary | ICD-10-CM | POA: Diagnosis not present

## 2021-03-08 DIAGNOSIS — N186 End stage renal disease: Secondary | ICD-10-CM | POA: Diagnosis not present

## 2021-03-08 DIAGNOSIS — D509 Iron deficiency anemia, unspecified: Secondary | ICD-10-CM | POA: Diagnosis not present

## 2021-03-08 DIAGNOSIS — D631 Anemia in chronic kidney disease: Secondary | ICD-10-CM | POA: Diagnosis not present

## 2021-03-08 DIAGNOSIS — N2581 Secondary hyperparathyroidism of renal origin: Secondary | ICD-10-CM | POA: Diagnosis not present

## 2021-03-08 DIAGNOSIS — Z992 Dependence on renal dialysis: Secondary | ICD-10-CM | POA: Diagnosis not present

## 2021-03-08 DIAGNOSIS — Z23 Encounter for immunization: Secondary | ICD-10-CM | POA: Diagnosis not present

## 2021-03-10 DIAGNOSIS — Z23 Encounter for immunization: Secondary | ICD-10-CM | POA: Diagnosis not present

## 2021-03-10 DIAGNOSIS — N2581 Secondary hyperparathyroidism of renal origin: Secondary | ICD-10-CM | POA: Diagnosis not present

## 2021-03-10 DIAGNOSIS — D631 Anemia in chronic kidney disease: Secondary | ICD-10-CM | POA: Diagnosis not present

## 2021-03-10 DIAGNOSIS — D509 Iron deficiency anemia, unspecified: Secondary | ICD-10-CM | POA: Diagnosis not present

## 2021-03-10 DIAGNOSIS — Z992 Dependence on renal dialysis: Secondary | ICD-10-CM | POA: Diagnosis not present

## 2021-03-10 DIAGNOSIS — N186 End stage renal disease: Secondary | ICD-10-CM | POA: Diagnosis not present

## 2021-03-12 DIAGNOSIS — Z992 Dependence on renal dialysis: Secondary | ICD-10-CM | POA: Diagnosis not present

## 2021-03-12 DIAGNOSIS — N2581 Secondary hyperparathyroidism of renal origin: Secondary | ICD-10-CM | POA: Diagnosis not present

## 2021-03-12 DIAGNOSIS — N186 End stage renal disease: Secondary | ICD-10-CM | POA: Diagnosis not present

## 2021-03-12 DIAGNOSIS — Z23 Encounter for immunization: Secondary | ICD-10-CM | POA: Diagnosis not present

## 2021-03-12 DIAGNOSIS — D509 Iron deficiency anemia, unspecified: Secondary | ICD-10-CM | POA: Diagnosis not present

## 2021-03-12 DIAGNOSIS — D631 Anemia in chronic kidney disease: Secondary | ICD-10-CM | POA: Diagnosis not present

## 2021-03-15 DIAGNOSIS — Z992 Dependence on renal dialysis: Secondary | ICD-10-CM | POA: Diagnosis not present

## 2021-03-15 DIAGNOSIS — D631 Anemia in chronic kidney disease: Secondary | ICD-10-CM | POA: Diagnosis not present

## 2021-03-15 DIAGNOSIS — N2581 Secondary hyperparathyroidism of renal origin: Secondary | ICD-10-CM | POA: Diagnosis not present

## 2021-03-15 DIAGNOSIS — Z23 Encounter for immunization: Secondary | ICD-10-CM | POA: Diagnosis not present

## 2021-03-15 DIAGNOSIS — D509 Iron deficiency anemia, unspecified: Secondary | ICD-10-CM | POA: Diagnosis not present

## 2021-03-15 DIAGNOSIS — N186 End stage renal disease: Secondary | ICD-10-CM | POA: Diagnosis not present

## 2021-03-17 DIAGNOSIS — D509 Iron deficiency anemia, unspecified: Secondary | ICD-10-CM | POA: Diagnosis not present

## 2021-03-17 DIAGNOSIS — N2581 Secondary hyperparathyroidism of renal origin: Secondary | ICD-10-CM | POA: Diagnosis not present

## 2021-03-17 DIAGNOSIS — D631 Anemia in chronic kidney disease: Secondary | ICD-10-CM | POA: Diagnosis not present

## 2021-03-17 DIAGNOSIS — N186 End stage renal disease: Secondary | ICD-10-CM | POA: Diagnosis not present

## 2021-03-17 DIAGNOSIS — Z992 Dependence on renal dialysis: Secondary | ICD-10-CM | POA: Diagnosis not present

## 2021-03-17 DIAGNOSIS — Z23 Encounter for immunization: Secondary | ICD-10-CM | POA: Diagnosis not present

## 2021-03-19 DIAGNOSIS — Z992 Dependence on renal dialysis: Secondary | ICD-10-CM | POA: Diagnosis not present

## 2021-03-19 DIAGNOSIS — D509 Iron deficiency anemia, unspecified: Secondary | ICD-10-CM | POA: Diagnosis not present

## 2021-03-19 DIAGNOSIS — N2581 Secondary hyperparathyroidism of renal origin: Secondary | ICD-10-CM | POA: Diagnosis not present

## 2021-03-19 DIAGNOSIS — Z23 Encounter for immunization: Secondary | ICD-10-CM | POA: Diagnosis not present

## 2021-03-19 DIAGNOSIS — N186 End stage renal disease: Secondary | ICD-10-CM | POA: Diagnosis not present

## 2021-03-19 DIAGNOSIS — D631 Anemia in chronic kidney disease: Secondary | ICD-10-CM | POA: Diagnosis not present

## 2021-03-20 DIAGNOSIS — N186 End stage renal disease: Secondary | ICD-10-CM | POA: Diagnosis not present

## 2021-03-20 DIAGNOSIS — Z992 Dependence on renal dialysis: Secondary | ICD-10-CM | POA: Diagnosis not present

## 2021-03-22 DIAGNOSIS — D631 Anemia in chronic kidney disease: Secondary | ICD-10-CM | POA: Diagnosis not present

## 2021-03-22 DIAGNOSIS — Z992 Dependence on renal dialysis: Secondary | ICD-10-CM | POA: Diagnosis not present

## 2021-03-22 DIAGNOSIS — Z23 Encounter for immunization: Secondary | ICD-10-CM | POA: Diagnosis not present

## 2021-03-22 DIAGNOSIS — N186 End stage renal disease: Secondary | ICD-10-CM | POA: Diagnosis not present

## 2021-03-22 DIAGNOSIS — D509 Iron deficiency anemia, unspecified: Secondary | ICD-10-CM | POA: Diagnosis not present

## 2021-03-22 DIAGNOSIS — N2581 Secondary hyperparathyroidism of renal origin: Secondary | ICD-10-CM | POA: Diagnosis not present

## 2021-03-24 DIAGNOSIS — N186 End stage renal disease: Secondary | ICD-10-CM | POA: Diagnosis not present

## 2021-03-24 DIAGNOSIS — N2581 Secondary hyperparathyroidism of renal origin: Secondary | ICD-10-CM | POA: Diagnosis not present

## 2021-03-24 DIAGNOSIS — Z992 Dependence on renal dialysis: Secondary | ICD-10-CM | POA: Diagnosis not present

## 2021-03-24 DIAGNOSIS — D631 Anemia in chronic kidney disease: Secondary | ICD-10-CM | POA: Diagnosis not present

## 2021-03-24 DIAGNOSIS — D509 Iron deficiency anemia, unspecified: Secondary | ICD-10-CM | POA: Diagnosis not present

## 2021-03-24 DIAGNOSIS — Z23 Encounter for immunization: Secondary | ICD-10-CM | POA: Diagnosis not present

## 2021-03-26 DIAGNOSIS — N2581 Secondary hyperparathyroidism of renal origin: Secondary | ICD-10-CM | POA: Diagnosis not present

## 2021-03-26 DIAGNOSIS — N186 End stage renal disease: Secondary | ICD-10-CM | POA: Diagnosis not present

## 2021-03-26 DIAGNOSIS — Z992 Dependence on renal dialysis: Secondary | ICD-10-CM | POA: Diagnosis not present

## 2021-03-26 DIAGNOSIS — D631 Anemia in chronic kidney disease: Secondary | ICD-10-CM | POA: Diagnosis not present

## 2021-03-26 DIAGNOSIS — Z23 Encounter for immunization: Secondary | ICD-10-CM | POA: Diagnosis not present

## 2021-03-26 DIAGNOSIS — D509 Iron deficiency anemia, unspecified: Secondary | ICD-10-CM | POA: Diagnosis not present

## 2021-03-29 DIAGNOSIS — D509 Iron deficiency anemia, unspecified: Secondary | ICD-10-CM | POA: Diagnosis not present

## 2021-03-29 DIAGNOSIS — Z992 Dependence on renal dialysis: Secondary | ICD-10-CM | POA: Diagnosis not present

## 2021-03-29 DIAGNOSIS — Z23 Encounter for immunization: Secondary | ICD-10-CM | POA: Diagnosis not present

## 2021-03-29 DIAGNOSIS — N186 End stage renal disease: Secondary | ICD-10-CM | POA: Diagnosis not present

## 2021-03-29 DIAGNOSIS — N2581 Secondary hyperparathyroidism of renal origin: Secondary | ICD-10-CM | POA: Diagnosis not present

## 2021-03-29 DIAGNOSIS — D631 Anemia in chronic kidney disease: Secondary | ICD-10-CM | POA: Diagnosis not present

## 2021-03-31 DIAGNOSIS — Z992 Dependence on renal dialysis: Secondary | ICD-10-CM | POA: Diagnosis not present

## 2021-03-31 DIAGNOSIS — Z23 Encounter for immunization: Secondary | ICD-10-CM | POA: Diagnosis not present

## 2021-03-31 DIAGNOSIS — N186 End stage renal disease: Secondary | ICD-10-CM | POA: Diagnosis not present

## 2021-03-31 DIAGNOSIS — I770 Arteriovenous fistula, acquired: Secondary | ICD-10-CM | POA: Diagnosis not present

## 2021-03-31 DIAGNOSIS — D509 Iron deficiency anemia, unspecified: Secondary | ICD-10-CM | POA: Diagnosis not present

## 2021-03-31 DIAGNOSIS — D631 Anemia in chronic kidney disease: Secondary | ICD-10-CM | POA: Diagnosis not present

## 2021-03-31 DIAGNOSIS — N2581 Secondary hyperparathyroidism of renal origin: Secondary | ICD-10-CM | POA: Diagnosis not present

## 2021-04-02 DIAGNOSIS — D509 Iron deficiency anemia, unspecified: Secondary | ICD-10-CM | POA: Diagnosis not present

## 2021-04-02 DIAGNOSIS — N2581 Secondary hyperparathyroidism of renal origin: Secondary | ICD-10-CM | POA: Diagnosis not present

## 2021-04-02 DIAGNOSIS — Z23 Encounter for immunization: Secondary | ICD-10-CM | POA: Diagnosis not present

## 2021-04-02 DIAGNOSIS — Z992 Dependence on renal dialysis: Secondary | ICD-10-CM | POA: Diagnosis not present

## 2021-04-02 DIAGNOSIS — N186 End stage renal disease: Secondary | ICD-10-CM | POA: Diagnosis not present

## 2021-04-02 DIAGNOSIS — D631 Anemia in chronic kidney disease: Secondary | ICD-10-CM | POA: Diagnosis not present

## 2021-04-05 DIAGNOSIS — Z23 Encounter for immunization: Secondary | ICD-10-CM | POA: Diagnosis not present

## 2021-04-05 DIAGNOSIS — Z992 Dependence on renal dialysis: Secondary | ICD-10-CM | POA: Diagnosis not present

## 2021-04-05 DIAGNOSIS — N186 End stage renal disease: Secondary | ICD-10-CM | POA: Diagnosis not present

## 2021-04-05 DIAGNOSIS — D509 Iron deficiency anemia, unspecified: Secondary | ICD-10-CM | POA: Diagnosis not present

## 2021-04-05 DIAGNOSIS — D631 Anemia in chronic kidney disease: Secondary | ICD-10-CM | POA: Diagnosis not present

## 2021-04-05 DIAGNOSIS — N2581 Secondary hyperparathyroidism of renal origin: Secondary | ICD-10-CM | POA: Diagnosis not present

## 2021-04-07 DIAGNOSIS — D631 Anemia in chronic kidney disease: Secondary | ICD-10-CM | POA: Diagnosis not present

## 2021-04-07 DIAGNOSIS — Z23 Encounter for immunization: Secondary | ICD-10-CM | POA: Diagnosis not present

## 2021-04-07 DIAGNOSIS — N2581 Secondary hyperparathyroidism of renal origin: Secondary | ICD-10-CM | POA: Diagnosis not present

## 2021-04-07 DIAGNOSIS — N186 End stage renal disease: Secondary | ICD-10-CM | POA: Diagnosis not present

## 2021-04-07 DIAGNOSIS — D509 Iron deficiency anemia, unspecified: Secondary | ICD-10-CM | POA: Diagnosis not present

## 2021-04-07 DIAGNOSIS — Z992 Dependence on renal dialysis: Secondary | ICD-10-CM | POA: Diagnosis not present

## 2021-04-09 DIAGNOSIS — Z992 Dependence on renal dialysis: Secondary | ICD-10-CM | POA: Diagnosis not present

## 2021-04-09 DIAGNOSIS — D631 Anemia in chronic kidney disease: Secondary | ICD-10-CM | POA: Diagnosis not present

## 2021-04-09 DIAGNOSIS — N186 End stage renal disease: Secondary | ICD-10-CM | POA: Diagnosis not present

## 2021-04-09 DIAGNOSIS — Z23 Encounter for immunization: Secondary | ICD-10-CM | POA: Diagnosis not present

## 2021-04-09 DIAGNOSIS — D509 Iron deficiency anemia, unspecified: Secondary | ICD-10-CM | POA: Diagnosis not present

## 2021-04-09 DIAGNOSIS — N2581 Secondary hyperparathyroidism of renal origin: Secondary | ICD-10-CM | POA: Diagnosis not present

## 2021-04-12 DIAGNOSIS — D631 Anemia in chronic kidney disease: Secondary | ICD-10-CM | POA: Diagnosis not present

## 2021-04-12 DIAGNOSIS — N186 End stage renal disease: Secondary | ICD-10-CM | POA: Diagnosis not present

## 2021-04-12 DIAGNOSIS — Z23 Encounter for immunization: Secondary | ICD-10-CM | POA: Diagnosis not present

## 2021-04-12 DIAGNOSIS — D509 Iron deficiency anemia, unspecified: Secondary | ICD-10-CM | POA: Diagnosis not present

## 2021-04-12 DIAGNOSIS — Z992 Dependence on renal dialysis: Secondary | ICD-10-CM | POA: Diagnosis not present

## 2021-04-12 DIAGNOSIS — N2581 Secondary hyperparathyroidism of renal origin: Secondary | ICD-10-CM | POA: Diagnosis not present

## 2021-04-14 DIAGNOSIS — N2581 Secondary hyperparathyroidism of renal origin: Secondary | ICD-10-CM | POA: Diagnosis not present

## 2021-04-14 DIAGNOSIS — Z992 Dependence on renal dialysis: Secondary | ICD-10-CM | POA: Diagnosis not present

## 2021-04-14 DIAGNOSIS — N186 End stage renal disease: Secondary | ICD-10-CM | POA: Diagnosis not present

## 2021-04-14 DIAGNOSIS — Z23 Encounter for immunization: Secondary | ICD-10-CM | POA: Diagnosis not present

## 2021-04-14 DIAGNOSIS — D509 Iron deficiency anemia, unspecified: Secondary | ICD-10-CM | POA: Diagnosis not present

## 2021-04-14 DIAGNOSIS — D631 Anemia in chronic kidney disease: Secondary | ICD-10-CM | POA: Diagnosis not present

## 2021-04-16 DIAGNOSIS — D631 Anemia in chronic kidney disease: Secondary | ICD-10-CM | POA: Diagnosis not present

## 2021-04-16 DIAGNOSIS — Z992 Dependence on renal dialysis: Secondary | ICD-10-CM | POA: Diagnosis not present

## 2021-04-16 DIAGNOSIS — N186 End stage renal disease: Secondary | ICD-10-CM | POA: Diagnosis not present

## 2021-04-16 DIAGNOSIS — Z23 Encounter for immunization: Secondary | ICD-10-CM | POA: Diagnosis not present

## 2021-04-16 DIAGNOSIS — N2581 Secondary hyperparathyroidism of renal origin: Secondary | ICD-10-CM | POA: Diagnosis not present

## 2021-04-16 DIAGNOSIS — D509 Iron deficiency anemia, unspecified: Secondary | ICD-10-CM | POA: Diagnosis not present

## 2021-04-19 DIAGNOSIS — Z992 Dependence on renal dialysis: Secondary | ICD-10-CM | POA: Diagnosis not present

## 2021-04-19 DIAGNOSIS — N2581 Secondary hyperparathyroidism of renal origin: Secondary | ICD-10-CM | POA: Diagnosis not present

## 2021-04-19 DIAGNOSIS — N186 End stage renal disease: Secondary | ICD-10-CM | POA: Diagnosis not present

## 2021-04-19 DIAGNOSIS — D509 Iron deficiency anemia, unspecified: Secondary | ICD-10-CM | POA: Diagnosis not present

## 2021-04-19 DIAGNOSIS — Z23 Encounter for immunization: Secondary | ICD-10-CM | POA: Diagnosis not present

## 2021-04-19 DIAGNOSIS — D631 Anemia in chronic kidney disease: Secondary | ICD-10-CM | POA: Diagnosis not present

## 2021-04-20 DIAGNOSIS — Z992 Dependence on renal dialysis: Secondary | ICD-10-CM | POA: Diagnosis not present

## 2021-04-20 DIAGNOSIS — N186 End stage renal disease: Secondary | ICD-10-CM | POA: Diagnosis not present

## 2021-04-21 DIAGNOSIS — D509 Iron deficiency anemia, unspecified: Secondary | ICD-10-CM | POA: Diagnosis not present

## 2021-04-21 DIAGNOSIS — D631 Anemia in chronic kidney disease: Secondary | ICD-10-CM | POA: Diagnosis not present

## 2021-04-21 DIAGNOSIS — N2581 Secondary hyperparathyroidism of renal origin: Secondary | ICD-10-CM | POA: Diagnosis not present

## 2021-04-21 DIAGNOSIS — N186 End stage renal disease: Secondary | ICD-10-CM | POA: Diagnosis not present

## 2021-04-21 DIAGNOSIS — Z992 Dependence on renal dialysis: Secondary | ICD-10-CM | POA: Diagnosis not present

## 2021-04-23 DIAGNOSIS — D631 Anemia in chronic kidney disease: Secondary | ICD-10-CM | POA: Diagnosis not present

## 2021-04-23 DIAGNOSIS — N186 End stage renal disease: Secondary | ICD-10-CM | POA: Diagnosis not present

## 2021-04-23 DIAGNOSIS — Z992 Dependence on renal dialysis: Secondary | ICD-10-CM | POA: Diagnosis not present

## 2021-04-23 DIAGNOSIS — N2581 Secondary hyperparathyroidism of renal origin: Secondary | ICD-10-CM | POA: Diagnosis not present

## 2021-04-23 DIAGNOSIS — D509 Iron deficiency anemia, unspecified: Secondary | ICD-10-CM | POA: Diagnosis not present

## 2021-04-26 DIAGNOSIS — N186 End stage renal disease: Secondary | ICD-10-CM | POA: Diagnosis not present

## 2021-04-26 DIAGNOSIS — N2581 Secondary hyperparathyroidism of renal origin: Secondary | ICD-10-CM | POA: Diagnosis not present

## 2021-04-26 DIAGNOSIS — D509 Iron deficiency anemia, unspecified: Secondary | ICD-10-CM | POA: Diagnosis not present

## 2021-04-26 DIAGNOSIS — Z992 Dependence on renal dialysis: Secondary | ICD-10-CM | POA: Diagnosis not present

## 2021-04-26 DIAGNOSIS — D631 Anemia in chronic kidney disease: Secondary | ICD-10-CM | POA: Diagnosis not present

## 2021-04-28 DIAGNOSIS — N186 End stage renal disease: Secondary | ICD-10-CM | POA: Diagnosis not present

## 2021-04-28 DIAGNOSIS — N2581 Secondary hyperparathyroidism of renal origin: Secondary | ICD-10-CM | POA: Diagnosis not present

## 2021-04-28 DIAGNOSIS — D631 Anemia in chronic kidney disease: Secondary | ICD-10-CM | POA: Diagnosis not present

## 2021-04-28 DIAGNOSIS — D509 Iron deficiency anemia, unspecified: Secondary | ICD-10-CM | POA: Diagnosis not present

## 2021-04-28 DIAGNOSIS — Z992 Dependence on renal dialysis: Secondary | ICD-10-CM | POA: Diagnosis not present

## 2021-04-30 DIAGNOSIS — N186 End stage renal disease: Secondary | ICD-10-CM | POA: Diagnosis not present

## 2021-04-30 DIAGNOSIS — Z992 Dependence on renal dialysis: Secondary | ICD-10-CM | POA: Diagnosis not present

## 2021-04-30 DIAGNOSIS — N2581 Secondary hyperparathyroidism of renal origin: Secondary | ICD-10-CM | POA: Diagnosis not present

## 2021-04-30 DIAGNOSIS — D631 Anemia in chronic kidney disease: Secondary | ICD-10-CM | POA: Diagnosis not present

## 2021-04-30 DIAGNOSIS — D509 Iron deficiency anemia, unspecified: Secondary | ICD-10-CM | POA: Diagnosis not present

## 2021-05-03 DIAGNOSIS — D509 Iron deficiency anemia, unspecified: Secondary | ICD-10-CM | POA: Diagnosis not present

## 2021-05-03 DIAGNOSIS — D631 Anemia in chronic kidney disease: Secondary | ICD-10-CM | POA: Diagnosis not present

## 2021-05-03 DIAGNOSIS — N186 End stage renal disease: Secondary | ICD-10-CM | POA: Diagnosis not present

## 2021-05-03 DIAGNOSIS — Z992 Dependence on renal dialysis: Secondary | ICD-10-CM | POA: Diagnosis not present

## 2021-05-03 DIAGNOSIS — N2581 Secondary hyperparathyroidism of renal origin: Secondary | ICD-10-CM | POA: Diagnosis not present

## 2021-05-05 DIAGNOSIS — D509 Iron deficiency anemia, unspecified: Secondary | ICD-10-CM | POA: Diagnosis not present

## 2021-05-05 DIAGNOSIS — N2581 Secondary hyperparathyroidism of renal origin: Secondary | ICD-10-CM | POA: Diagnosis not present

## 2021-05-05 DIAGNOSIS — D631 Anemia in chronic kidney disease: Secondary | ICD-10-CM | POA: Diagnosis not present

## 2021-05-05 DIAGNOSIS — Z992 Dependence on renal dialysis: Secondary | ICD-10-CM | POA: Diagnosis not present

## 2021-05-05 DIAGNOSIS — N186 End stage renal disease: Secondary | ICD-10-CM | POA: Diagnosis not present

## 2021-05-07 DIAGNOSIS — Z992 Dependence on renal dialysis: Secondary | ICD-10-CM | POA: Diagnosis not present

## 2021-05-07 DIAGNOSIS — D631 Anemia in chronic kidney disease: Secondary | ICD-10-CM | POA: Diagnosis not present

## 2021-05-07 DIAGNOSIS — N186 End stage renal disease: Secondary | ICD-10-CM | POA: Diagnosis not present

## 2021-05-07 DIAGNOSIS — D509 Iron deficiency anemia, unspecified: Secondary | ICD-10-CM | POA: Diagnosis not present

## 2021-05-07 DIAGNOSIS — N2581 Secondary hyperparathyroidism of renal origin: Secondary | ICD-10-CM | POA: Diagnosis not present

## 2021-05-10 DIAGNOSIS — D509 Iron deficiency anemia, unspecified: Secondary | ICD-10-CM | POA: Diagnosis not present

## 2021-05-10 DIAGNOSIS — N2581 Secondary hyperparathyroidism of renal origin: Secondary | ICD-10-CM | POA: Diagnosis not present

## 2021-05-10 DIAGNOSIS — D631 Anemia in chronic kidney disease: Secondary | ICD-10-CM | POA: Diagnosis not present

## 2021-05-10 DIAGNOSIS — Z992 Dependence on renal dialysis: Secondary | ICD-10-CM | POA: Diagnosis not present

## 2021-05-10 DIAGNOSIS — N186 End stage renal disease: Secondary | ICD-10-CM | POA: Diagnosis not present

## 2021-05-12 DIAGNOSIS — N2581 Secondary hyperparathyroidism of renal origin: Secondary | ICD-10-CM | POA: Diagnosis not present

## 2021-05-12 DIAGNOSIS — N186 End stage renal disease: Secondary | ICD-10-CM | POA: Diagnosis not present

## 2021-05-12 DIAGNOSIS — D509 Iron deficiency anemia, unspecified: Secondary | ICD-10-CM | POA: Diagnosis not present

## 2021-05-12 DIAGNOSIS — Z992 Dependence on renal dialysis: Secondary | ICD-10-CM | POA: Diagnosis not present

## 2021-05-12 DIAGNOSIS — D631 Anemia in chronic kidney disease: Secondary | ICD-10-CM | POA: Diagnosis not present

## 2021-05-14 DIAGNOSIS — D631 Anemia in chronic kidney disease: Secondary | ICD-10-CM | POA: Diagnosis not present

## 2021-05-14 DIAGNOSIS — D509 Iron deficiency anemia, unspecified: Secondary | ICD-10-CM | POA: Diagnosis not present

## 2021-05-14 DIAGNOSIS — N2581 Secondary hyperparathyroidism of renal origin: Secondary | ICD-10-CM | POA: Diagnosis not present

## 2021-05-14 DIAGNOSIS — Z992 Dependence on renal dialysis: Secondary | ICD-10-CM | POA: Diagnosis not present

## 2021-05-14 DIAGNOSIS — N186 End stage renal disease: Secondary | ICD-10-CM | POA: Diagnosis not present

## 2021-05-17 DIAGNOSIS — N2581 Secondary hyperparathyroidism of renal origin: Secondary | ICD-10-CM | POA: Diagnosis not present

## 2021-05-17 DIAGNOSIS — N186 End stage renal disease: Secondary | ICD-10-CM | POA: Diagnosis not present

## 2021-05-17 DIAGNOSIS — Z992 Dependence on renal dialysis: Secondary | ICD-10-CM | POA: Diagnosis not present

## 2021-05-17 DIAGNOSIS — D631 Anemia in chronic kidney disease: Secondary | ICD-10-CM | POA: Diagnosis not present

## 2021-05-17 DIAGNOSIS — D509 Iron deficiency anemia, unspecified: Secondary | ICD-10-CM | POA: Diagnosis not present

## 2021-05-19 DIAGNOSIS — D509 Iron deficiency anemia, unspecified: Secondary | ICD-10-CM | POA: Diagnosis not present

## 2021-05-19 DIAGNOSIS — N186 End stage renal disease: Secondary | ICD-10-CM | POA: Diagnosis not present

## 2021-05-19 DIAGNOSIS — Z992 Dependence on renal dialysis: Secondary | ICD-10-CM | POA: Diagnosis not present

## 2021-05-19 DIAGNOSIS — D631 Anemia in chronic kidney disease: Secondary | ICD-10-CM | POA: Diagnosis not present

## 2021-05-19 DIAGNOSIS — N2581 Secondary hyperparathyroidism of renal origin: Secondary | ICD-10-CM | POA: Diagnosis not present

## 2021-05-20 DIAGNOSIS — Z992 Dependence on renal dialysis: Secondary | ICD-10-CM | POA: Diagnosis not present

## 2021-05-20 DIAGNOSIS — N186 End stage renal disease: Secondary | ICD-10-CM | POA: Diagnosis not present

## 2021-05-21 DIAGNOSIS — Z992 Dependence on renal dialysis: Secondary | ICD-10-CM | POA: Diagnosis not present

## 2021-05-21 DIAGNOSIS — N2581 Secondary hyperparathyroidism of renal origin: Secondary | ICD-10-CM | POA: Diagnosis not present

## 2021-05-21 DIAGNOSIS — D631 Anemia in chronic kidney disease: Secondary | ICD-10-CM | POA: Diagnosis not present

## 2021-05-21 DIAGNOSIS — D509 Iron deficiency anemia, unspecified: Secondary | ICD-10-CM | POA: Diagnosis not present

## 2021-05-21 DIAGNOSIS — N186 End stage renal disease: Secondary | ICD-10-CM | POA: Diagnosis not present

## 2021-05-24 DIAGNOSIS — D509 Iron deficiency anemia, unspecified: Secondary | ICD-10-CM | POA: Diagnosis not present

## 2021-05-24 DIAGNOSIS — N2581 Secondary hyperparathyroidism of renal origin: Secondary | ICD-10-CM | POA: Diagnosis not present

## 2021-05-24 DIAGNOSIS — Z992 Dependence on renal dialysis: Secondary | ICD-10-CM | POA: Diagnosis not present

## 2021-05-24 DIAGNOSIS — D631 Anemia in chronic kidney disease: Secondary | ICD-10-CM | POA: Diagnosis not present

## 2021-05-24 DIAGNOSIS — N186 End stage renal disease: Secondary | ICD-10-CM | POA: Diagnosis not present

## 2021-05-26 DIAGNOSIS — D509 Iron deficiency anemia, unspecified: Secondary | ICD-10-CM | POA: Diagnosis not present

## 2021-05-26 DIAGNOSIS — N186 End stage renal disease: Secondary | ICD-10-CM | POA: Diagnosis not present

## 2021-05-26 DIAGNOSIS — N2581 Secondary hyperparathyroidism of renal origin: Secondary | ICD-10-CM | POA: Diagnosis not present

## 2021-05-26 DIAGNOSIS — Z992 Dependence on renal dialysis: Secondary | ICD-10-CM | POA: Diagnosis not present

## 2021-05-26 DIAGNOSIS — D631 Anemia in chronic kidney disease: Secondary | ICD-10-CM | POA: Diagnosis not present

## 2021-05-28 DIAGNOSIS — Z992 Dependence on renal dialysis: Secondary | ICD-10-CM | POA: Diagnosis not present

## 2021-05-28 DIAGNOSIS — N186 End stage renal disease: Secondary | ICD-10-CM | POA: Diagnosis not present

## 2021-05-28 DIAGNOSIS — D509 Iron deficiency anemia, unspecified: Secondary | ICD-10-CM | POA: Diagnosis not present

## 2021-05-28 DIAGNOSIS — D631 Anemia in chronic kidney disease: Secondary | ICD-10-CM | POA: Diagnosis not present

## 2021-05-28 DIAGNOSIS — N2581 Secondary hyperparathyroidism of renal origin: Secondary | ICD-10-CM | POA: Diagnosis not present

## 2021-05-31 DIAGNOSIS — D509 Iron deficiency anemia, unspecified: Secondary | ICD-10-CM | POA: Diagnosis not present

## 2021-05-31 DIAGNOSIS — N2581 Secondary hyperparathyroidism of renal origin: Secondary | ICD-10-CM | POA: Diagnosis not present

## 2021-05-31 DIAGNOSIS — D631 Anemia in chronic kidney disease: Secondary | ICD-10-CM | POA: Diagnosis not present

## 2021-05-31 DIAGNOSIS — Z992 Dependence on renal dialysis: Secondary | ICD-10-CM | POA: Diagnosis not present

## 2021-05-31 DIAGNOSIS — N186 End stage renal disease: Secondary | ICD-10-CM | POA: Diagnosis not present

## 2021-06-02 DIAGNOSIS — N186 End stage renal disease: Secondary | ICD-10-CM | POA: Diagnosis not present

## 2021-06-02 DIAGNOSIS — D509 Iron deficiency anemia, unspecified: Secondary | ICD-10-CM | POA: Diagnosis not present

## 2021-06-02 DIAGNOSIS — N2581 Secondary hyperparathyroidism of renal origin: Secondary | ICD-10-CM | POA: Diagnosis not present

## 2021-06-02 DIAGNOSIS — D631 Anemia in chronic kidney disease: Secondary | ICD-10-CM | POA: Diagnosis not present

## 2021-06-02 DIAGNOSIS — Z992 Dependence on renal dialysis: Secondary | ICD-10-CM | POA: Diagnosis not present

## 2021-06-04 DIAGNOSIS — D509 Iron deficiency anemia, unspecified: Secondary | ICD-10-CM | POA: Diagnosis not present

## 2021-06-04 DIAGNOSIS — N2581 Secondary hyperparathyroidism of renal origin: Secondary | ICD-10-CM | POA: Diagnosis not present

## 2021-06-04 DIAGNOSIS — D631 Anemia in chronic kidney disease: Secondary | ICD-10-CM | POA: Diagnosis not present

## 2021-06-04 DIAGNOSIS — N186 End stage renal disease: Secondary | ICD-10-CM | POA: Diagnosis not present

## 2021-06-04 DIAGNOSIS — Z992 Dependence on renal dialysis: Secondary | ICD-10-CM | POA: Diagnosis not present

## 2021-06-07 DIAGNOSIS — D509 Iron deficiency anemia, unspecified: Secondary | ICD-10-CM | POA: Diagnosis not present

## 2021-06-07 DIAGNOSIS — Z992 Dependence on renal dialysis: Secondary | ICD-10-CM | POA: Diagnosis not present

## 2021-06-07 DIAGNOSIS — N186 End stage renal disease: Secondary | ICD-10-CM | POA: Diagnosis not present

## 2021-06-07 DIAGNOSIS — D631 Anemia in chronic kidney disease: Secondary | ICD-10-CM | POA: Diagnosis not present

## 2021-06-07 DIAGNOSIS — N2581 Secondary hyperparathyroidism of renal origin: Secondary | ICD-10-CM | POA: Diagnosis not present

## 2021-06-08 DIAGNOSIS — Z992 Dependence on renal dialysis: Secondary | ICD-10-CM | POA: Diagnosis not present

## 2021-06-08 DIAGNOSIS — I120 Hypertensive chronic kidney disease with stage 5 chronic kidney disease or end stage renal disease: Secondary | ICD-10-CM | POA: Diagnosis not present

## 2021-06-08 DIAGNOSIS — I517 Cardiomegaly: Secondary | ICD-10-CM | POA: Diagnosis not present

## 2021-06-08 DIAGNOSIS — D631 Anemia in chronic kidney disease: Secondary | ICD-10-CM | POA: Diagnosis not present

## 2021-06-08 DIAGNOSIS — T82590A Other mechanical complication of surgically created arteriovenous fistula, initial encounter: Secondary | ICD-10-CM | POA: Diagnosis not present

## 2021-06-08 DIAGNOSIS — E785 Hyperlipidemia, unspecified: Secondary | ICD-10-CM | POA: Diagnosis not present

## 2021-06-08 DIAGNOSIS — N186 End stage renal disease: Secondary | ICD-10-CM | POA: Diagnosis not present

## 2021-06-09 DIAGNOSIS — N2581 Secondary hyperparathyroidism of renal origin: Secondary | ICD-10-CM | POA: Diagnosis not present

## 2021-06-09 DIAGNOSIS — D509 Iron deficiency anemia, unspecified: Secondary | ICD-10-CM | POA: Diagnosis not present

## 2021-06-09 DIAGNOSIS — D631 Anemia in chronic kidney disease: Secondary | ICD-10-CM | POA: Diagnosis not present

## 2021-06-09 DIAGNOSIS — N186 End stage renal disease: Secondary | ICD-10-CM | POA: Diagnosis not present

## 2021-06-09 DIAGNOSIS — Z992 Dependence on renal dialysis: Secondary | ICD-10-CM | POA: Diagnosis not present

## 2021-06-11 DIAGNOSIS — N2581 Secondary hyperparathyroidism of renal origin: Secondary | ICD-10-CM | POA: Diagnosis not present

## 2021-06-11 DIAGNOSIS — N186 End stage renal disease: Secondary | ICD-10-CM | POA: Diagnosis not present

## 2021-06-11 DIAGNOSIS — Z992 Dependence on renal dialysis: Secondary | ICD-10-CM | POA: Diagnosis not present

## 2021-06-11 DIAGNOSIS — D509 Iron deficiency anemia, unspecified: Secondary | ICD-10-CM | POA: Diagnosis not present

## 2021-06-11 DIAGNOSIS — D631 Anemia in chronic kidney disease: Secondary | ICD-10-CM | POA: Diagnosis not present

## 2021-06-14 DIAGNOSIS — Z992 Dependence on renal dialysis: Secondary | ICD-10-CM | POA: Diagnosis not present

## 2021-06-14 DIAGNOSIS — D509 Iron deficiency anemia, unspecified: Secondary | ICD-10-CM | POA: Diagnosis not present

## 2021-06-14 DIAGNOSIS — N186 End stage renal disease: Secondary | ICD-10-CM | POA: Diagnosis not present

## 2021-06-14 DIAGNOSIS — D631 Anemia in chronic kidney disease: Secondary | ICD-10-CM | POA: Diagnosis not present

## 2021-06-14 DIAGNOSIS — N2581 Secondary hyperparathyroidism of renal origin: Secondary | ICD-10-CM | POA: Diagnosis not present

## 2021-06-16 DIAGNOSIS — N2581 Secondary hyperparathyroidism of renal origin: Secondary | ICD-10-CM | POA: Diagnosis not present

## 2021-06-16 DIAGNOSIS — Z992 Dependence on renal dialysis: Secondary | ICD-10-CM | POA: Diagnosis not present

## 2021-06-16 DIAGNOSIS — N186 End stage renal disease: Secondary | ICD-10-CM | POA: Diagnosis not present

## 2021-06-16 DIAGNOSIS — D509 Iron deficiency anemia, unspecified: Secondary | ICD-10-CM | POA: Diagnosis not present

## 2021-06-16 DIAGNOSIS — D631 Anemia in chronic kidney disease: Secondary | ICD-10-CM | POA: Diagnosis not present

## 2021-06-18 DIAGNOSIS — N2581 Secondary hyperparathyroidism of renal origin: Secondary | ICD-10-CM | POA: Diagnosis not present

## 2021-06-18 DIAGNOSIS — N186 End stage renal disease: Secondary | ICD-10-CM | POA: Diagnosis not present

## 2021-06-18 DIAGNOSIS — Z992 Dependence on renal dialysis: Secondary | ICD-10-CM | POA: Diagnosis not present

## 2021-06-18 DIAGNOSIS — D509 Iron deficiency anemia, unspecified: Secondary | ICD-10-CM | POA: Diagnosis not present

## 2021-06-18 DIAGNOSIS — D631 Anemia in chronic kidney disease: Secondary | ICD-10-CM | POA: Diagnosis not present

## 2021-06-20 DIAGNOSIS — N186 End stage renal disease: Secondary | ICD-10-CM | POA: Diagnosis not present

## 2021-06-20 DIAGNOSIS — Z992 Dependence on renal dialysis: Secondary | ICD-10-CM | POA: Diagnosis not present

## 2021-06-21 DIAGNOSIS — Z992 Dependence on renal dialysis: Secondary | ICD-10-CM | POA: Diagnosis not present

## 2021-06-21 DIAGNOSIS — N2581 Secondary hyperparathyroidism of renal origin: Secondary | ICD-10-CM | POA: Diagnosis not present

## 2021-06-21 DIAGNOSIS — Z23 Encounter for immunization: Secondary | ICD-10-CM | POA: Diagnosis not present

## 2021-06-21 DIAGNOSIS — N186 End stage renal disease: Secondary | ICD-10-CM | POA: Diagnosis not present

## 2021-06-21 DIAGNOSIS — D631 Anemia in chronic kidney disease: Secondary | ICD-10-CM | POA: Diagnosis not present

## 2021-06-21 DIAGNOSIS — D509 Iron deficiency anemia, unspecified: Secondary | ICD-10-CM | POA: Diagnosis not present

## 2021-06-23 DIAGNOSIS — D631 Anemia in chronic kidney disease: Secondary | ICD-10-CM | POA: Diagnosis not present

## 2021-06-23 DIAGNOSIS — D509 Iron deficiency anemia, unspecified: Secondary | ICD-10-CM | POA: Diagnosis not present

## 2021-06-23 DIAGNOSIS — N186 End stage renal disease: Secondary | ICD-10-CM | POA: Diagnosis not present

## 2021-06-23 DIAGNOSIS — Z992 Dependence on renal dialysis: Secondary | ICD-10-CM | POA: Diagnosis not present

## 2021-06-23 DIAGNOSIS — Z23 Encounter for immunization: Secondary | ICD-10-CM | POA: Diagnosis not present

## 2021-06-23 DIAGNOSIS — N2581 Secondary hyperparathyroidism of renal origin: Secondary | ICD-10-CM | POA: Diagnosis not present

## 2021-06-25 DIAGNOSIS — N2581 Secondary hyperparathyroidism of renal origin: Secondary | ICD-10-CM | POA: Diagnosis not present

## 2021-06-25 DIAGNOSIS — D631 Anemia in chronic kidney disease: Secondary | ICD-10-CM | POA: Diagnosis not present

## 2021-06-25 DIAGNOSIS — Z23 Encounter for immunization: Secondary | ICD-10-CM | POA: Diagnosis not present

## 2021-06-25 DIAGNOSIS — D509 Iron deficiency anemia, unspecified: Secondary | ICD-10-CM | POA: Diagnosis not present

## 2021-06-25 DIAGNOSIS — N186 End stage renal disease: Secondary | ICD-10-CM | POA: Diagnosis not present

## 2021-06-25 DIAGNOSIS — Z992 Dependence on renal dialysis: Secondary | ICD-10-CM | POA: Diagnosis not present

## 2021-06-28 DIAGNOSIS — Z23 Encounter for immunization: Secondary | ICD-10-CM | POA: Diagnosis not present

## 2021-06-28 DIAGNOSIS — N186 End stage renal disease: Secondary | ICD-10-CM | POA: Diagnosis not present

## 2021-06-28 DIAGNOSIS — Z992 Dependence on renal dialysis: Secondary | ICD-10-CM | POA: Diagnosis not present

## 2021-06-28 DIAGNOSIS — D631 Anemia in chronic kidney disease: Secondary | ICD-10-CM | POA: Diagnosis not present

## 2021-06-28 DIAGNOSIS — N2581 Secondary hyperparathyroidism of renal origin: Secondary | ICD-10-CM | POA: Diagnosis not present

## 2021-06-28 DIAGNOSIS — D509 Iron deficiency anemia, unspecified: Secondary | ICD-10-CM | POA: Diagnosis not present

## 2021-06-30 DIAGNOSIS — Z23 Encounter for immunization: Secondary | ICD-10-CM | POA: Diagnosis not present

## 2021-06-30 DIAGNOSIS — D509 Iron deficiency anemia, unspecified: Secondary | ICD-10-CM | POA: Diagnosis not present

## 2021-06-30 DIAGNOSIS — N2581 Secondary hyperparathyroidism of renal origin: Secondary | ICD-10-CM | POA: Diagnosis not present

## 2021-06-30 DIAGNOSIS — N186 End stage renal disease: Secondary | ICD-10-CM | POA: Diagnosis not present

## 2021-06-30 DIAGNOSIS — Z992 Dependence on renal dialysis: Secondary | ICD-10-CM | POA: Diagnosis not present

## 2021-06-30 DIAGNOSIS — D631 Anemia in chronic kidney disease: Secondary | ICD-10-CM | POA: Diagnosis not present

## 2021-07-02 DIAGNOSIS — D509 Iron deficiency anemia, unspecified: Secondary | ICD-10-CM | POA: Diagnosis not present

## 2021-07-02 DIAGNOSIS — N2581 Secondary hyperparathyroidism of renal origin: Secondary | ICD-10-CM | POA: Diagnosis not present

## 2021-07-02 DIAGNOSIS — Z992 Dependence on renal dialysis: Secondary | ICD-10-CM | POA: Diagnosis not present

## 2021-07-02 DIAGNOSIS — D631 Anemia in chronic kidney disease: Secondary | ICD-10-CM | POA: Diagnosis not present

## 2021-07-02 DIAGNOSIS — N186 End stage renal disease: Secondary | ICD-10-CM | POA: Diagnosis not present

## 2021-07-02 DIAGNOSIS — Z23 Encounter for immunization: Secondary | ICD-10-CM | POA: Diagnosis not present

## 2021-07-05 DIAGNOSIS — N186 End stage renal disease: Secondary | ICD-10-CM | POA: Diagnosis not present

## 2021-07-05 DIAGNOSIS — D631 Anemia in chronic kidney disease: Secondary | ICD-10-CM | POA: Diagnosis not present

## 2021-07-05 DIAGNOSIS — Z992 Dependence on renal dialysis: Secondary | ICD-10-CM | POA: Diagnosis not present

## 2021-07-05 DIAGNOSIS — N2581 Secondary hyperparathyroidism of renal origin: Secondary | ICD-10-CM | POA: Diagnosis not present

## 2021-07-05 DIAGNOSIS — Z23 Encounter for immunization: Secondary | ICD-10-CM | POA: Diagnosis not present

## 2021-07-05 DIAGNOSIS — D509 Iron deficiency anemia, unspecified: Secondary | ICD-10-CM | POA: Diagnosis not present

## 2021-07-07 DIAGNOSIS — D631 Anemia in chronic kidney disease: Secondary | ICD-10-CM | POA: Diagnosis not present

## 2021-07-07 DIAGNOSIS — Z992 Dependence on renal dialysis: Secondary | ICD-10-CM | POA: Diagnosis not present

## 2021-07-07 DIAGNOSIS — D509 Iron deficiency anemia, unspecified: Secondary | ICD-10-CM | POA: Diagnosis not present

## 2021-07-07 DIAGNOSIS — N186 End stage renal disease: Secondary | ICD-10-CM | POA: Diagnosis not present

## 2021-07-07 DIAGNOSIS — N2581 Secondary hyperparathyroidism of renal origin: Secondary | ICD-10-CM | POA: Diagnosis not present

## 2021-07-07 DIAGNOSIS — Z23 Encounter for immunization: Secondary | ICD-10-CM | POA: Diagnosis not present

## 2021-07-09 DIAGNOSIS — D631 Anemia in chronic kidney disease: Secondary | ICD-10-CM | POA: Diagnosis not present

## 2021-07-09 DIAGNOSIS — Z992 Dependence on renal dialysis: Secondary | ICD-10-CM | POA: Diagnosis not present

## 2021-07-09 DIAGNOSIS — N2581 Secondary hyperparathyroidism of renal origin: Secondary | ICD-10-CM | POA: Diagnosis not present

## 2021-07-09 DIAGNOSIS — N186 End stage renal disease: Secondary | ICD-10-CM | POA: Diagnosis not present

## 2021-07-09 DIAGNOSIS — Z23 Encounter for immunization: Secondary | ICD-10-CM | POA: Diagnosis not present

## 2021-07-09 DIAGNOSIS — D509 Iron deficiency anemia, unspecified: Secondary | ICD-10-CM | POA: Diagnosis not present

## 2021-07-12 DIAGNOSIS — Z23 Encounter for immunization: Secondary | ICD-10-CM | POA: Diagnosis not present

## 2021-07-12 DIAGNOSIS — N186 End stage renal disease: Secondary | ICD-10-CM | POA: Diagnosis not present

## 2021-07-12 DIAGNOSIS — N2581 Secondary hyperparathyroidism of renal origin: Secondary | ICD-10-CM | POA: Diagnosis not present

## 2021-07-12 DIAGNOSIS — D631 Anemia in chronic kidney disease: Secondary | ICD-10-CM | POA: Diagnosis not present

## 2021-07-12 DIAGNOSIS — D509 Iron deficiency anemia, unspecified: Secondary | ICD-10-CM | POA: Diagnosis not present

## 2021-07-12 DIAGNOSIS — Z992 Dependence on renal dialysis: Secondary | ICD-10-CM | POA: Diagnosis not present

## 2021-07-14 DIAGNOSIS — Z992 Dependence on renal dialysis: Secondary | ICD-10-CM | POA: Diagnosis not present

## 2021-07-14 DIAGNOSIS — N186 End stage renal disease: Secondary | ICD-10-CM | POA: Diagnosis not present

## 2021-07-14 DIAGNOSIS — D631 Anemia in chronic kidney disease: Secondary | ICD-10-CM | POA: Diagnosis not present

## 2021-07-14 DIAGNOSIS — N2581 Secondary hyperparathyroidism of renal origin: Secondary | ICD-10-CM | POA: Diagnosis not present

## 2021-07-14 DIAGNOSIS — Z23 Encounter for immunization: Secondary | ICD-10-CM | POA: Diagnosis not present

## 2021-07-14 DIAGNOSIS — D509 Iron deficiency anemia, unspecified: Secondary | ICD-10-CM | POA: Diagnosis not present

## 2021-07-16 DIAGNOSIS — N186 End stage renal disease: Secondary | ICD-10-CM | POA: Diagnosis not present

## 2021-07-16 DIAGNOSIS — D631 Anemia in chronic kidney disease: Secondary | ICD-10-CM | POA: Diagnosis not present

## 2021-07-16 DIAGNOSIS — N2581 Secondary hyperparathyroidism of renal origin: Secondary | ICD-10-CM | POA: Diagnosis not present

## 2021-07-16 DIAGNOSIS — Z23 Encounter for immunization: Secondary | ICD-10-CM | POA: Diagnosis not present

## 2021-07-16 DIAGNOSIS — Z992 Dependence on renal dialysis: Secondary | ICD-10-CM | POA: Diagnosis not present

## 2021-07-16 DIAGNOSIS — D509 Iron deficiency anemia, unspecified: Secondary | ICD-10-CM | POA: Diagnosis not present

## 2021-07-19 DIAGNOSIS — D631 Anemia in chronic kidney disease: Secondary | ICD-10-CM | POA: Diagnosis not present

## 2021-07-19 DIAGNOSIS — Z992 Dependence on renal dialysis: Secondary | ICD-10-CM | POA: Diagnosis not present

## 2021-07-19 DIAGNOSIS — Z23 Encounter for immunization: Secondary | ICD-10-CM | POA: Diagnosis not present

## 2021-07-19 DIAGNOSIS — N2581 Secondary hyperparathyroidism of renal origin: Secondary | ICD-10-CM | POA: Diagnosis not present

## 2021-07-19 DIAGNOSIS — N186 End stage renal disease: Secondary | ICD-10-CM | POA: Diagnosis not present

## 2021-07-19 DIAGNOSIS — D509 Iron deficiency anemia, unspecified: Secondary | ICD-10-CM | POA: Diagnosis not present

## 2021-07-21 DIAGNOSIS — N186 End stage renal disease: Secondary | ICD-10-CM | POA: Diagnosis not present

## 2021-07-21 DIAGNOSIS — Z23 Encounter for immunization: Secondary | ICD-10-CM | POA: Diagnosis not present

## 2021-07-21 DIAGNOSIS — N2581 Secondary hyperparathyroidism of renal origin: Secondary | ICD-10-CM | POA: Diagnosis not present

## 2021-07-21 DIAGNOSIS — D631 Anemia in chronic kidney disease: Secondary | ICD-10-CM | POA: Diagnosis not present

## 2021-07-21 DIAGNOSIS — Z992 Dependence on renal dialysis: Secondary | ICD-10-CM | POA: Diagnosis not present

## 2021-07-21 DIAGNOSIS — D509 Iron deficiency anemia, unspecified: Secondary | ICD-10-CM | POA: Diagnosis not present

## 2021-07-22 DIAGNOSIS — Z1331 Encounter for screening for depression: Secondary | ICD-10-CM | POA: Diagnosis not present

## 2021-07-22 DIAGNOSIS — I10 Essential (primary) hypertension: Secondary | ICD-10-CM | POA: Diagnosis not present

## 2021-07-22 DIAGNOSIS — Z6826 Body mass index (BMI) 26.0-26.9, adult: Secondary | ICD-10-CM | POA: Diagnosis not present

## 2021-07-22 DIAGNOSIS — E119 Type 2 diabetes mellitus without complications: Secondary | ICD-10-CM | POA: Diagnosis not present

## 2021-07-22 DIAGNOSIS — Z992 Dependence on renal dialysis: Secondary | ICD-10-CM | POA: Diagnosis not present

## 2021-07-22 DIAGNOSIS — E7849 Other hyperlipidemia: Secondary | ICD-10-CM | POA: Diagnosis not present

## 2021-07-22 DIAGNOSIS — E663 Overweight: Secondary | ICD-10-CM | POA: Diagnosis not present

## 2021-07-23 DIAGNOSIS — D631 Anemia in chronic kidney disease: Secondary | ICD-10-CM | POA: Diagnosis not present

## 2021-07-23 DIAGNOSIS — Z992 Dependence on renal dialysis: Secondary | ICD-10-CM | POA: Diagnosis not present

## 2021-07-23 DIAGNOSIS — D509 Iron deficiency anemia, unspecified: Secondary | ICD-10-CM | POA: Diagnosis not present

## 2021-07-23 DIAGNOSIS — N186 End stage renal disease: Secondary | ICD-10-CM | POA: Diagnosis not present

## 2021-07-26 DIAGNOSIS — N186 End stage renal disease: Secondary | ICD-10-CM | POA: Diagnosis not present

## 2021-07-26 DIAGNOSIS — Z992 Dependence on renal dialysis: Secondary | ICD-10-CM | POA: Diagnosis not present

## 2021-07-26 DIAGNOSIS — D631 Anemia in chronic kidney disease: Secondary | ICD-10-CM | POA: Diagnosis not present

## 2021-07-26 DIAGNOSIS — D509 Iron deficiency anemia, unspecified: Secondary | ICD-10-CM | POA: Diagnosis not present

## 2021-07-28 DIAGNOSIS — Z992 Dependence on renal dialysis: Secondary | ICD-10-CM | POA: Diagnosis not present

## 2021-07-28 DIAGNOSIS — D631 Anemia in chronic kidney disease: Secondary | ICD-10-CM | POA: Diagnosis not present

## 2021-07-28 DIAGNOSIS — D509 Iron deficiency anemia, unspecified: Secondary | ICD-10-CM | POA: Diagnosis not present

## 2021-07-28 DIAGNOSIS — N186 End stage renal disease: Secondary | ICD-10-CM | POA: Diagnosis not present

## 2021-07-29 DIAGNOSIS — Z992 Dependence on renal dialysis: Secondary | ICD-10-CM | POA: Diagnosis not present

## 2021-07-29 DIAGNOSIS — Z452 Encounter for adjustment and management of vascular access device: Secondary | ICD-10-CM | POA: Diagnosis not present

## 2021-07-29 DIAGNOSIS — N186 End stage renal disease: Secondary | ICD-10-CM | POA: Diagnosis not present

## 2021-08-02 ENCOUNTER — Other Ambulatory Visit (HOSPITAL_COMMUNITY): Payer: Self-pay | Admitting: Family Medicine

## 2021-08-02 DIAGNOSIS — N186 End stage renal disease: Secondary | ICD-10-CM | POA: Diagnosis not present

## 2021-08-02 DIAGNOSIS — D631 Anemia in chronic kidney disease: Secondary | ICD-10-CM | POA: Diagnosis not present

## 2021-08-02 DIAGNOSIS — Z13820 Encounter for screening for osteoporosis: Secondary | ICD-10-CM

## 2021-08-02 DIAGNOSIS — Z992 Dependence on renal dialysis: Secondary | ICD-10-CM | POA: Diagnosis not present

## 2021-08-02 DIAGNOSIS — D509 Iron deficiency anemia, unspecified: Secondary | ICD-10-CM | POA: Diagnosis not present

## 2021-08-02 DIAGNOSIS — M8580 Other specified disorders of bone density and structure, unspecified site: Secondary | ICD-10-CM

## 2021-08-03 ENCOUNTER — Other Ambulatory Visit (HOSPITAL_COMMUNITY): Payer: Self-pay | Admitting: Family Medicine

## 2021-08-03 DIAGNOSIS — Z780 Asymptomatic menopausal state: Secondary | ICD-10-CM

## 2021-08-03 DIAGNOSIS — Z1231 Encounter for screening mammogram for malignant neoplasm of breast: Secondary | ICD-10-CM

## 2021-08-04 DIAGNOSIS — D631 Anemia in chronic kidney disease: Secondary | ICD-10-CM | POA: Diagnosis not present

## 2021-08-04 DIAGNOSIS — N186 End stage renal disease: Secondary | ICD-10-CM | POA: Diagnosis not present

## 2021-08-04 DIAGNOSIS — Z992 Dependence on renal dialysis: Secondary | ICD-10-CM | POA: Diagnosis not present

## 2021-08-04 DIAGNOSIS — D509 Iron deficiency anemia, unspecified: Secondary | ICD-10-CM | POA: Diagnosis not present

## 2021-08-06 DIAGNOSIS — Z992 Dependence on renal dialysis: Secondary | ICD-10-CM | POA: Diagnosis not present

## 2021-08-06 DIAGNOSIS — D509 Iron deficiency anemia, unspecified: Secondary | ICD-10-CM | POA: Diagnosis not present

## 2021-08-06 DIAGNOSIS — D631 Anemia in chronic kidney disease: Secondary | ICD-10-CM | POA: Diagnosis not present

## 2021-08-06 DIAGNOSIS — N186 End stage renal disease: Secondary | ICD-10-CM | POA: Diagnosis not present

## 2021-08-09 ENCOUNTER — Encounter (INDEPENDENT_AMBULATORY_CARE_PROVIDER_SITE_OTHER): Payer: Self-pay | Admitting: *Deleted

## 2021-08-09 DIAGNOSIS — D631 Anemia in chronic kidney disease: Secondary | ICD-10-CM | POA: Diagnosis not present

## 2021-08-09 DIAGNOSIS — Z992 Dependence on renal dialysis: Secondary | ICD-10-CM | POA: Diagnosis not present

## 2021-08-09 DIAGNOSIS — D509 Iron deficiency anemia, unspecified: Secondary | ICD-10-CM | POA: Diagnosis not present

## 2021-08-09 DIAGNOSIS — N186 End stage renal disease: Secondary | ICD-10-CM | POA: Diagnosis not present

## 2021-08-11 DIAGNOSIS — D631 Anemia in chronic kidney disease: Secondary | ICD-10-CM | POA: Diagnosis not present

## 2021-08-11 DIAGNOSIS — Z992 Dependence on renal dialysis: Secondary | ICD-10-CM | POA: Diagnosis not present

## 2021-08-11 DIAGNOSIS — N186 End stage renal disease: Secondary | ICD-10-CM | POA: Diagnosis not present

## 2021-08-11 DIAGNOSIS — D509 Iron deficiency anemia, unspecified: Secondary | ICD-10-CM | POA: Diagnosis not present

## 2021-08-13 DIAGNOSIS — D631 Anemia in chronic kidney disease: Secondary | ICD-10-CM | POA: Diagnosis not present

## 2021-08-13 DIAGNOSIS — Z992 Dependence on renal dialysis: Secondary | ICD-10-CM | POA: Diagnosis not present

## 2021-08-13 DIAGNOSIS — D509 Iron deficiency anemia, unspecified: Secondary | ICD-10-CM | POA: Diagnosis not present

## 2021-08-13 DIAGNOSIS — N186 End stage renal disease: Secondary | ICD-10-CM | POA: Diagnosis not present

## 2021-08-16 DIAGNOSIS — D509 Iron deficiency anemia, unspecified: Secondary | ICD-10-CM | POA: Diagnosis not present

## 2021-08-16 DIAGNOSIS — Z992 Dependence on renal dialysis: Secondary | ICD-10-CM | POA: Diagnosis not present

## 2021-08-16 DIAGNOSIS — N186 End stage renal disease: Secondary | ICD-10-CM | POA: Diagnosis not present

## 2021-08-16 DIAGNOSIS — D631 Anemia in chronic kidney disease: Secondary | ICD-10-CM | POA: Diagnosis not present

## 2021-08-18 DIAGNOSIS — N186 End stage renal disease: Secondary | ICD-10-CM | POA: Diagnosis not present

## 2021-08-18 DIAGNOSIS — D509 Iron deficiency anemia, unspecified: Secondary | ICD-10-CM | POA: Diagnosis not present

## 2021-08-18 DIAGNOSIS — Z992 Dependence on renal dialysis: Secondary | ICD-10-CM | POA: Diagnosis not present

## 2021-08-18 DIAGNOSIS — D631 Anemia in chronic kidney disease: Secondary | ICD-10-CM | POA: Diagnosis not present

## 2021-08-20 DIAGNOSIS — Z992 Dependence on renal dialysis: Secondary | ICD-10-CM | POA: Diagnosis not present

## 2021-08-20 DIAGNOSIS — N186 End stage renal disease: Secondary | ICD-10-CM | POA: Diagnosis not present

## 2021-08-20 DIAGNOSIS — D509 Iron deficiency anemia, unspecified: Secondary | ICD-10-CM | POA: Diagnosis not present

## 2021-08-20 DIAGNOSIS — D631 Anemia in chronic kidney disease: Secondary | ICD-10-CM | POA: Diagnosis not present

## 2021-08-23 DIAGNOSIS — D509 Iron deficiency anemia, unspecified: Secondary | ICD-10-CM | POA: Diagnosis not present

## 2021-08-23 DIAGNOSIS — D631 Anemia in chronic kidney disease: Secondary | ICD-10-CM | POA: Diagnosis not present

## 2021-08-23 DIAGNOSIS — N2581 Secondary hyperparathyroidism of renal origin: Secondary | ICD-10-CM | POA: Diagnosis not present

## 2021-08-23 DIAGNOSIS — Z992 Dependence on renal dialysis: Secondary | ICD-10-CM | POA: Diagnosis not present

## 2021-08-23 DIAGNOSIS — N186 End stage renal disease: Secondary | ICD-10-CM | POA: Diagnosis not present

## 2021-08-25 DIAGNOSIS — N2581 Secondary hyperparathyroidism of renal origin: Secondary | ICD-10-CM | POA: Diagnosis not present

## 2021-08-25 DIAGNOSIS — N186 End stage renal disease: Secondary | ICD-10-CM | POA: Diagnosis not present

## 2021-08-25 DIAGNOSIS — D631 Anemia in chronic kidney disease: Secondary | ICD-10-CM | POA: Diagnosis not present

## 2021-08-25 DIAGNOSIS — D509 Iron deficiency anemia, unspecified: Secondary | ICD-10-CM | POA: Diagnosis not present

## 2021-08-25 DIAGNOSIS — Z992 Dependence on renal dialysis: Secondary | ICD-10-CM | POA: Diagnosis not present

## 2021-08-27 DIAGNOSIS — N186 End stage renal disease: Secondary | ICD-10-CM | POA: Diagnosis not present

## 2021-08-27 DIAGNOSIS — N2581 Secondary hyperparathyroidism of renal origin: Secondary | ICD-10-CM | POA: Diagnosis not present

## 2021-08-27 DIAGNOSIS — D631 Anemia in chronic kidney disease: Secondary | ICD-10-CM | POA: Diagnosis not present

## 2021-08-27 DIAGNOSIS — D509 Iron deficiency anemia, unspecified: Secondary | ICD-10-CM | POA: Diagnosis not present

## 2021-08-27 DIAGNOSIS — Z992 Dependence on renal dialysis: Secondary | ICD-10-CM | POA: Diagnosis not present

## 2021-08-30 DIAGNOSIS — Z992 Dependence on renal dialysis: Secondary | ICD-10-CM | POA: Diagnosis not present

## 2021-08-30 DIAGNOSIS — D509 Iron deficiency anemia, unspecified: Secondary | ICD-10-CM | POA: Diagnosis not present

## 2021-08-30 DIAGNOSIS — D631 Anemia in chronic kidney disease: Secondary | ICD-10-CM | POA: Diagnosis not present

## 2021-08-30 DIAGNOSIS — N2581 Secondary hyperparathyroidism of renal origin: Secondary | ICD-10-CM | POA: Diagnosis not present

## 2021-08-30 DIAGNOSIS — N186 End stage renal disease: Secondary | ICD-10-CM | POA: Diagnosis not present

## 2021-09-01 DIAGNOSIS — N2581 Secondary hyperparathyroidism of renal origin: Secondary | ICD-10-CM | POA: Diagnosis not present

## 2021-09-01 DIAGNOSIS — Z992 Dependence on renal dialysis: Secondary | ICD-10-CM | POA: Diagnosis not present

## 2021-09-01 DIAGNOSIS — D631 Anemia in chronic kidney disease: Secondary | ICD-10-CM | POA: Diagnosis not present

## 2021-09-01 DIAGNOSIS — D509 Iron deficiency anemia, unspecified: Secondary | ICD-10-CM | POA: Diagnosis not present

## 2021-09-01 DIAGNOSIS — N186 End stage renal disease: Secondary | ICD-10-CM | POA: Diagnosis not present

## 2021-09-03 DIAGNOSIS — D509 Iron deficiency anemia, unspecified: Secondary | ICD-10-CM | POA: Diagnosis not present

## 2021-09-03 DIAGNOSIS — Z992 Dependence on renal dialysis: Secondary | ICD-10-CM | POA: Diagnosis not present

## 2021-09-03 DIAGNOSIS — N186 End stage renal disease: Secondary | ICD-10-CM | POA: Diagnosis not present

## 2021-09-03 DIAGNOSIS — N2581 Secondary hyperparathyroidism of renal origin: Secondary | ICD-10-CM | POA: Diagnosis not present

## 2021-09-03 DIAGNOSIS — D631 Anemia in chronic kidney disease: Secondary | ICD-10-CM | POA: Diagnosis not present

## 2021-09-06 DIAGNOSIS — D631 Anemia in chronic kidney disease: Secondary | ICD-10-CM | POA: Diagnosis not present

## 2021-09-06 DIAGNOSIS — Z992 Dependence on renal dialysis: Secondary | ICD-10-CM | POA: Diagnosis not present

## 2021-09-06 DIAGNOSIS — N186 End stage renal disease: Secondary | ICD-10-CM | POA: Diagnosis not present

## 2021-09-06 DIAGNOSIS — N2581 Secondary hyperparathyroidism of renal origin: Secondary | ICD-10-CM | POA: Diagnosis not present

## 2021-09-06 DIAGNOSIS — D509 Iron deficiency anemia, unspecified: Secondary | ICD-10-CM | POA: Diagnosis not present

## 2021-09-08 DIAGNOSIS — D509 Iron deficiency anemia, unspecified: Secondary | ICD-10-CM | POA: Diagnosis not present

## 2021-09-08 DIAGNOSIS — Z992 Dependence on renal dialysis: Secondary | ICD-10-CM | POA: Diagnosis not present

## 2021-09-08 DIAGNOSIS — N186 End stage renal disease: Secondary | ICD-10-CM | POA: Diagnosis not present

## 2021-09-08 DIAGNOSIS — N2581 Secondary hyperparathyroidism of renal origin: Secondary | ICD-10-CM | POA: Diagnosis not present

## 2021-09-08 DIAGNOSIS — D631 Anemia in chronic kidney disease: Secondary | ICD-10-CM | POA: Diagnosis not present

## 2021-09-10 DIAGNOSIS — N2581 Secondary hyperparathyroidism of renal origin: Secondary | ICD-10-CM | POA: Diagnosis not present

## 2021-09-10 DIAGNOSIS — N186 End stage renal disease: Secondary | ICD-10-CM | POA: Diagnosis not present

## 2021-09-10 DIAGNOSIS — D631 Anemia in chronic kidney disease: Secondary | ICD-10-CM | POA: Diagnosis not present

## 2021-09-10 DIAGNOSIS — D509 Iron deficiency anemia, unspecified: Secondary | ICD-10-CM | POA: Diagnosis not present

## 2021-09-10 DIAGNOSIS — Z992 Dependence on renal dialysis: Secondary | ICD-10-CM | POA: Diagnosis not present

## 2021-09-13 DIAGNOSIS — D631 Anemia in chronic kidney disease: Secondary | ICD-10-CM | POA: Diagnosis not present

## 2021-09-13 DIAGNOSIS — Z992 Dependence on renal dialysis: Secondary | ICD-10-CM | POA: Diagnosis not present

## 2021-09-13 DIAGNOSIS — N2581 Secondary hyperparathyroidism of renal origin: Secondary | ICD-10-CM | POA: Diagnosis not present

## 2021-09-13 DIAGNOSIS — N186 End stage renal disease: Secondary | ICD-10-CM | POA: Diagnosis not present

## 2021-09-13 DIAGNOSIS — D509 Iron deficiency anemia, unspecified: Secondary | ICD-10-CM | POA: Diagnosis not present

## 2021-09-15 DIAGNOSIS — N2581 Secondary hyperparathyroidism of renal origin: Secondary | ICD-10-CM | POA: Diagnosis not present

## 2021-09-15 DIAGNOSIS — D631 Anemia in chronic kidney disease: Secondary | ICD-10-CM | POA: Diagnosis not present

## 2021-09-15 DIAGNOSIS — Z992 Dependence on renal dialysis: Secondary | ICD-10-CM | POA: Diagnosis not present

## 2021-09-15 DIAGNOSIS — N186 End stage renal disease: Secondary | ICD-10-CM | POA: Diagnosis not present

## 2021-09-15 DIAGNOSIS — D509 Iron deficiency anemia, unspecified: Secondary | ICD-10-CM | POA: Diagnosis not present

## 2021-09-17 DIAGNOSIS — N2581 Secondary hyperparathyroidism of renal origin: Secondary | ICD-10-CM | POA: Diagnosis not present

## 2021-09-17 DIAGNOSIS — Z992 Dependence on renal dialysis: Secondary | ICD-10-CM | POA: Diagnosis not present

## 2021-09-17 DIAGNOSIS — D509 Iron deficiency anemia, unspecified: Secondary | ICD-10-CM | POA: Diagnosis not present

## 2021-09-17 DIAGNOSIS — D631 Anemia in chronic kidney disease: Secondary | ICD-10-CM | POA: Diagnosis not present

## 2021-09-17 DIAGNOSIS — N186 End stage renal disease: Secondary | ICD-10-CM | POA: Diagnosis not present

## 2021-09-20 DIAGNOSIS — N2581 Secondary hyperparathyroidism of renal origin: Secondary | ICD-10-CM | POA: Diagnosis not present

## 2021-09-20 DIAGNOSIS — N186 End stage renal disease: Secondary | ICD-10-CM | POA: Diagnosis not present

## 2021-09-20 DIAGNOSIS — D509 Iron deficiency anemia, unspecified: Secondary | ICD-10-CM | POA: Diagnosis not present

## 2021-09-20 DIAGNOSIS — D631 Anemia in chronic kidney disease: Secondary | ICD-10-CM | POA: Diagnosis not present

## 2021-09-20 DIAGNOSIS — Z992 Dependence on renal dialysis: Secondary | ICD-10-CM | POA: Diagnosis not present

## 2021-09-22 DIAGNOSIS — D509 Iron deficiency anemia, unspecified: Secondary | ICD-10-CM | POA: Diagnosis not present

## 2021-09-22 DIAGNOSIS — N186 End stage renal disease: Secondary | ICD-10-CM | POA: Diagnosis not present

## 2021-09-22 DIAGNOSIS — N2581 Secondary hyperparathyroidism of renal origin: Secondary | ICD-10-CM | POA: Diagnosis not present

## 2021-09-22 DIAGNOSIS — D631 Anemia in chronic kidney disease: Secondary | ICD-10-CM | POA: Diagnosis not present

## 2021-09-22 DIAGNOSIS — Z992 Dependence on renal dialysis: Secondary | ICD-10-CM | POA: Diagnosis not present

## 2021-09-27 ENCOUNTER — Encounter: Payer: Self-pay | Admitting: *Deleted

## 2021-09-27 DIAGNOSIS — N186 End stage renal disease: Secondary | ICD-10-CM | POA: Diagnosis not present

## 2021-09-27 DIAGNOSIS — N2581 Secondary hyperparathyroidism of renal origin: Secondary | ICD-10-CM | POA: Diagnosis not present

## 2021-09-27 DIAGNOSIS — Z992 Dependence on renal dialysis: Secondary | ICD-10-CM | POA: Diagnosis not present

## 2021-09-27 DIAGNOSIS — D509 Iron deficiency anemia, unspecified: Secondary | ICD-10-CM | POA: Diagnosis not present

## 2021-09-27 DIAGNOSIS — D631 Anemia in chronic kidney disease: Secondary | ICD-10-CM | POA: Diagnosis not present

## 2021-09-29 DIAGNOSIS — D509 Iron deficiency anemia, unspecified: Secondary | ICD-10-CM | POA: Diagnosis not present

## 2021-09-29 DIAGNOSIS — N2581 Secondary hyperparathyroidism of renal origin: Secondary | ICD-10-CM | POA: Diagnosis not present

## 2021-09-29 DIAGNOSIS — N186 End stage renal disease: Secondary | ICD-10-CM | POA: Diagnosis not present

## 2021-09-29 DIAGNOSIS — Z992 Dependence on renal dialysis: Secondary | ICD-10-CM | POA: Diagnosis not present

## 2021-09-29 DIAGNOSIS — D631 Anemia in chronic kidney disease: Secondary | ICD-10-CM | POA: Diagnosis not present

## 2021-09-30 ENCOUNTER — Other Ambulatory Visit (HOSPITAL_COMMUNITY): Payer: Medicare Other

## 2021-09-30 ENCOUNTER — Ambulatory Visit (HOSPITAL_COMMUNITY): Payer: Medicare Other

## 2021-10-01 DIAGNOSIS — N2581 Secondary hyperparathyroidism of renal origin: Secondary | ICD-10-CM | POA: Diagnosis not present

## 2021-10-01 DIAGNOSIS — N186 End stage renal disease: Secondary | ICD-10-CM | POA: Diagnosis not present

## 2021-10-01 DIAGNOSIS — Z992 Dependence on renal dialysis: Secondary | ICD-10-CM | POA: Diagnosis not present

## 2021-10-01 DIAGNOSIS — D631 Anemia in chronic kidney disease: Secondary | ICD-10-CM | POA: Diagnosis not present

## 2021-10-01 DIAGNOSIS — D509 Iron deficiency anemia, unspecified: Secondary | ICD-10-CM | POA: Diagnosis not present

## 2021-10-04 DIAGNOSIS — D631 Anemia in chronic kidney disease: Secondary | ICD-10-CM | POA: Diagnosis not present

## 2021-10-04 DIAGNOSIS — Z992 Dependence on renal dialysis: Secondary | ICD-10-CM | POA: Diagnosis not present

## 2021-10-04 DIAGNOSIS — N186 End stage renal disease: Secondary | ICD-10-CM | POA: Diagnosis not present

## 2021-10-04 DIAGNOSIS — N2581 Secondary hyperparathyroidism of renal origin: Secondary | ICD-10-CM | POA: Diagnosis not present

## 2021-10-04 DIAGNOSIS — D509 Iron deficiency anemia, unspecified: Secondary | ICD-10-CM | POA: Diagnosis not present

## 2021-10-06 DIAGNOSIS — D631 Anemia in chronic kidney disease: Secondary | ICD-10-CM | POA: Diagnosis not present

## 2021-10-06 DIAGNOSIS — Z992 Dependence on renal dialysis: Secondary | ICD-10-CM | POA: Diagnosis not present

## 2021-10-06 DIAGNOSIS — N186 End stage renal disease: Secondary | ICD-10-CM | POA: Diagnosis not present

## 2021-10-06 DIAGNOSIS — N2581 Secondary hyperparathyroidism of renal origin: Secondary | ICD-10-CM | POA: Diagnosis not present

## 2021-10-06 DIAGNOSIS — D509 Iron deficiency anemia, unspecified: Secondary | ICD-10-CM | POA: Diagnosis not present

## 2021-10-08 DIAGNOSIS — D631 Anemia in chronic kidney disease: Secondary | ICD-10-CM | POA: Diagnosis not present

## 2021-10-08 DIAGNOSIS — D509 Iron deficiency anemia, unspecified: Secondary | ICD-10-CM | POA: Diagnosis not present

## 2021-10-08 DIAGNOSIS — Z992 Dependence on renal dialysis: Secondary | ICD-10-CM | POA: Diagnosis not present

## 2021-10-08 DIAGNOSIS — N2581 Secondary hyperparathyroidism of renal origin: Secondary | ICD-10-CM | POA: Diagnosis not present

## 2021-10-08 DIAGNOSIS — N186 End stage renal disease: Secondary | ICD-10-CM | POA: Diagnosis not present

## 2021-10-11 DIAGNOSIS — N2581 Secondary hyperparathyroidism of renal origin: Secondary | ICD-10-CM | POA: Diagnosis not present

## 2021-10-11 DIAGNOSIS — Z992 Dependence on renal dialysis: Secondary | ICD-10-CM | POA: Diagnosis not present

## 2021-10-11 DIAGNOSIS — D631 Anemia in chronic kidney disease: Secondary | ICD-10-CM | POA: Diagnosis not present

## 2021-10-11 DIAGNOSIS — D509 Iron deficiency anemia, unspecified: Secondary | ICD-10-CM | POA: Diagnosis not present

## 2021-10-11 DIAGNOSIS — N186 End stage renal disease: Secondary | ICD-10-CM | POA: Diagnosis not present

## 2021-10-13 DIAGNOSIS — Z992 Dependence on renal dialysis: Secondary | ICD-10-CM | POA: Diagnosis not present

## 2021-10-13 DIAGNOSIS — D631 Anemia in chronic kidney disease: Secondary | ICD-10-CM | POA: Diagnosis not present

## 2021-10-13 DIAGNOSIS — D509 Iron deficiency anemia, unspecified: Secondary | ICD-10-CM | POA: Diagnosis not present

## 2021-10-13 DIAGNOSIS — N186 End stage renal disease: Secondary | ICD-10-CM | POA: Diagnosis not present

## 2021-10-13 DIAGNOSIS — N2581 Secondary hyperparathyroidism of renal origin: Secondary | ICD-10-CM | POA: Diagnosis not present

## 2021-10-15 DIAGNOSIS — D631 Anemia in chronic kidney disease: Secondary | ICD-10-CM | POA: Diagnosis not present

## 2021-10-15 DIAGNOSIS — Z992 Dependence on renal dialysis: Secondary | ICD-10-CM | POA: Diagnosis not present

## 2021-10-15 DIAGNOSIS — D509 Iron deficiency anemia, unspecified: Secondary | ICD-10-CM | POA: Diagnosis not present

## 2021-10-15 DIAGNOSIS — N2581 Secondary hyperparathyroidism of renal origin: Secondary | ICD-10-CM | POA: Diagnosis not present

## 2021-10-15 DIAGNOSIS — N186 End stage renal disease: Secondary | ICD-10-CM | POA: Diagnosis not present

## 2021-10-18 DIAGNOSIS — Z992 Dependence on renal dialysis: Secondary | ICD-10-CM | POA: Diagnosis not present

## 2021-10-18 DIAGNOSIS — D509 Iron deficiency anemia, unspecified: Secondary | ICD-10-CM | POA: Diagnosis not present

## 2021-10-18 DIAGNOSIS — D631 Anemia in chronic kidney disease: Secondary | ICD-10-CM | POA: Diagnosis not present

## 2021-10-18 DIAGNOSIS — N186 End stage renal disease: Secondary | ICD-10-CM | POA: Diagnosis not present

## 2021-10-18 DIAGNOSIS — N2581 Secondary hyperparathyroidism of renal origin: Secondary | ICD-10-CM | POA: Diagnosis not present

## 2021-10-20 ENCOUNTER — Encounter: Payer: Self-pay | Admitting: *Deleted

## 2021-10-20 ENCOUNTER — Ambulatory Visit (INDEPENDENT_AMBULATORY_CARE_PROVIDER_SITE_OTHER): Payer: Self-pay | Admitting: *Deleted

## 2021-10-20 ENCOUNTER — Other Ambulatory Visit: Payer: Self-pay

## 2021-10-20 VITALS — Ht 65.0 in | Wt 161.0 lb

## 2021-10-20 DIAGNOSIS — N186 End stage renal disease: Secondary | ICD-10-CM | POA: Diagnosis not present

## 2021-10-20 DIAGNOSIS — Z1211 Encounter for screening for malignant neoplasm of colon: Secondary | ICD-10-CM

## 2021-10-20 DIAGNOSIS — N2581 Secondary hyperparathyroidism of renal origin: Secondary | ICD-10-CM | POA: Diagnosis not present

## 2021-10-20 DIAGNOSIS — Z992 Dependence on renal dialysis: Secondary | ICD-10-CM | POA: Diagnosis not present

## 2021-10-20 DIAGNOSIS — D631 Anemia in chronic kidney disease: Secondary | ICD-10-CM | POA: Diagnosis not present

## 2021-10-20 DIAGNOSIS — D509 Iron deficiency anemia, unspecified: Secondary | ICD-10-CM | POA: Diagnosis not present

## 2021-10-20 MED ORDER — PEG 3350-KCL-NA BICARB-NACL 420 G PO SOLR: 4000.0000 mL | Freq: Once | ORAL | 0 refills | Status: AC

## 2021-10-20 NOTE — H&P (View-Only) (Signed)
Patient medical history and medications, she needs an office visit to arrange colonoscopy.

## 2021-10-20 NOTE — Progress Notes (Signed)
Gastroenterology Pre-Procedure Review  Request Date:10/20/2021 Requesting Physician: [name], PA-C, no previous TCS  PATIENT REVIEW QUESTIONS: The patient responded to the following health history questions as indicated:    1. Diabetes Melitis: no 2. Joint replacements in the past 12 months: no 3. Major health problems in the past 3 months: no 4. Has an artificial valve or MVP: no 5. Has a defibrillator: no 6. Has been advised in past to take antibiotics in advance of a procedure like teeth cleaning: no 7. Family history of colon cancer: no  8. Alcohol Use: no 9. Illicit drug Use: no 10. History of sleep apnea: no  11. History of coronary artery or other vascular stents placed within the last 12 months: no 12. History of any prior anesthesia complications: no 13. Body mass index is 26.79 kg/m.    MEDICATIONS & ALLERGIES:    Patient reports the following regarding taking any blood thinners:   Plavix? Yes, 75 mg daily Aspirin? no Coumadin? no Brilinta? no Xarelto? no Eliquis? no Pradaxa? no Savaysa? no Effient? no  Patient confirms/reports the following medications:  Current Outpatient Medications  Medication Sig Dispense Refill   acetaminophen (TYLENOL) 500 MG tablet Take 1,000 mg by mouth as needed.     carvedilol (COREG) 25 MG tablet Take 25 mg by mouth daily at 6 (six) AM.     clopidogrel (PLAVIX) 75 MG tablet Take 75 mg by mouth daily.     escitalopram (LEXAPRO) 10 MG tablet Take 10 mg by mouth daily.     NIFEdipine (PROCARDIA XL/ADALAT-CC) 90 MG 24 hr tablet Take 1 tablet (90 mg total) by mouth daily. 30 tablet 3   No current facility-administered medications for this visit.    Patient confirms/reports the following allergies:  No Known Allergies  No orders of the defined types were placed in this encounter.   AUTHORIZATION INFORMATION Primary Insurance: Medicare,  ID #: [id number] Pre-Cert / Auth required: No, not required  Secondary Insurance:  [facility],  [place] #: [id number],  Group #: [id number] Pre-Cert / Auth required: No, not required  SCHEDULE INFORMATION: Procedure has been scheduled as follows:  Date: 11/08/2021, Time: 9:00 Location: [facility] with Dr. Abbey Chatters  This Gastroenterology Pre-Precedure Review Form is being routed to the following provider(s): Aliene Altes, PA-C

## 2021-10-20 NOTE — Progress Notes (Signed)
Patient medical history and medications, she needs an office visit to arrange colonoscopy.

## 2021-10-20 NOTE — Progress Notes (Signed)
Pt has dialysis on Mon, Wed, and Fridays.  Receives Iron and Heparin by IV.

## 2021-10-21 ENCOUNTER — Ambulatory Visit (HOSPITAL_COMMUNITY)
Admission: RE | Admit: 2021-10-21 | Discharge: 2021-10-21 | Disposition: A | Discharge status: Home / Self Care | Payer: Medicare Other | Source: Ambulatory Visit | Attending: Family Medicine | Admitting: Family Medicine

## 2021-10-21 DIAGNOSIS — Z780 Asymptomatic menopausal state: Secondary | ICD-10-CM | POA: Insufficient documentation

## 2021-10-21 DIAGNOSIS — M85852 Other specified disorders of bone density and structure, left thigh: Secondary | ICD-10-CM | POA: Insufficient documentation

## 2021-10-21 DIAGNOSIS — Z1231 Encounter for screening mammogram for malignant neoplasm of breast: Secondary | ICD-10-CM | POA: Diagnosis not present

## 2021-10-21 DIAGNOSIS — Z13820 Encounter for screening for osteoporosis: Secondary | ICD-10-CM | POA: Diagnosis not present

## 2021-10-21 NOTE — Progress Notes (Signed)
Pt called back.  Confirmed ov appt.

## 2021-10-21 NOTE — Progress Notes (Signed)
Called pt and left message for her to call me back.  Need to confirm ov for 10/28/2021 at 8:30.  Holding procedure date as is for 11/08/2021 (ASA 3 day).  Discussed with Aliene Altes, PA-C.  This is agreeable with her.  She said we can always cancel / reschedule procedure if needed.  We will know more at time of her ov.  If approved, orders will need to be placed and pt will need Pre-op as well.

## 2021-10-22 DIAGNOSIS — D509 Iron deficiency anemia, unspecified: Secondary | ICD-10-CM | POA: Diagnosis not present

## 2021-10-22 DIAGNOSIS — N2581 Secondary hyperparathyroidism of renal origin: Secondary | ICD-10-CM | POA: Diagnosis not present

## 2021-10-22 DIAGNOSIS — Z992 Dependence on renal dialysis: Secondary | ICD-10-CM | POA: Diagnosis not present

## 2021-10-22 DIAGNOSIS — D631 Anemia in chronic kidney disease: Secondary | ICD-10-CM | POA: Diagnosis not present

## 2021-10-22 DIAGNOSIS — N186 End stage renal disease: Secondary | ICD-10-CM | POA: Diagnosis not present

## 2021-10-25 ENCOUNTER — Other Ambulatory Visit (HOSPITAL_COMMUNITY): Payer: Self-pay | Admitting: Family Medicine

## 2021-10-25 DIAGNOSIS — Z992 Dependence on renal dialysis: Secondary | ICD-10-CM | POA: Diagnosis not present

## 2021-10-25 DIAGNOSIS — D631 Anemia in chronic kidney disease: Secondary | ICD-10-CM | POA: Diagnosis not present

## 2021-10-25 DIAGNOSIS — N186 End stage renal disease: Secondary | ICD-10-CM | POA: Diagnosis not present

## 2021-10-25 DIAGNOSIS — R928 Other abnormal and inconclusive findings on diagnostic imaging of breast: Secondary | ICD-10-CM

## 2021-10-25 DIAGNOSIS — N2581 Secondary hyperparathyroidism of renal origin: Secondary | ICD-10-CM | POA: Diagnosis not present

## 2021-10-25 DIAGNOSIS — D509 Iron deficiency anemia, unspecified: Secondary | ICD-10-CM | POA: Diagnosis not present

## 2021-10-27 ENCOUNTER — Encounter: Payer: Self-pay | Admitting: Gastroenterology

## 2021-10-27 DIAGNOSIS — N2581 Secondary hyperparathyroidism of renal origin: Secondary | ICD-10-CM | POA: Diagnosis not present

## 2021-10-27 DIAGNOSIS — D509 Iron deficiency anemia, unspecified: Secondary | ICD-10-CM | POA: Diagnosis not present

## 2021-10-27 DIAGNOSIS — Z992 Dependence on renal dialysis: Secondary | ICD-10-CM | POA: Diagnosis not present

## 2021-10-27 DIAGNOSIS — D631 Anemia in chronic kidney disease: Secondary | ICD-10-CM | POA: Diagnosis not present

## 2021-10-27 DIAGNOSIS — N186 End stage renal disease: Secondary | ICD-10-CM | POA: Diagnosis not present

## 2021-10-27 NOTE — H&P (Signed)
Referring Provider:  [name], PA-C Primary Care Physician:  Scherrie Bateman Primary Gastroenterologist:  Dr. Abbey Chatters  Chief Complaint  Patient presents with   Colonoscopy    On schedule for 12/19 with Dr. Abbey Chatters    HPI:   Angel Davis is a 70 y.o. female presenting today at the request of  [name], PA-C for consult colonoscopy.  Patient has never had a colonoscopy.  She was initially a nurse triage visit, but recommended office visit due to medical history and medications.  She currently has a place held for a colonoscopy on 11/08/2021.  Today: States she is doing well overall today.  Denies abdominal pain, constipation, diarrhea, BRBPR, melena, GERD, dysphagia, nausea, vomiting.Good appetite.   Got sick in December 2019, she had undiagnosed symptoms of pneumonia. Thinks she likely had COVID. States she was in the hospital at [facility] for 1 month. 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 Some left sided weakness at that time, now resolved. Reports she was told she had a heart attack as well, based on an EKG. Saw cardiology and was told nothing needed to be done. Reports she did have follow-up with cardiology and was dismissed as there was nothing else that needed to be done. Denies chest pain or palpitations. She is on Plavix, doesn't follow with neurology.  She also developed has had to hold Plavix in the past for fistula placement, no problems. Also with this prolonged illness she had a very poor appetite and nausea along with diarrhea for quite some time.  She lost quite a bit of weight during this time.  She also developed kidney failure during this time.  When she was started on dialysis in December 2021 (MWF), she finally started to feel more like her normal self.  At this time, she is feeling very well, better than she has in years.  Her weight has been stable since April.  States she is eating very well with no problems.  Past Medical History:  Diagnosis Date    Abnormal EKG    Acute gout 06/29/2014   AKI (acute kidney injury) ([place]) 11/23/2018   12/16-12/31/2019 [place] : peak Creatinine 4.39   Anemia    H&H of 10.3/33.5 in 12/2011   Cerebral infarction [facility]) 11/23/2018   12/16-12/31/2019 [facility] MRI performed for encephalopathy in the setting of strep pneumoniae bacteremia and acute hypoxic respiratory failure with findings of multifocal subacute infarcts Plavix and high dose statin initiated   CKD (chronic kidney disease)    On dialysis M, W, F   Depression    DEPRESSION 03/13/2008   Qualifier: Diagnosis of  By: Vinetta Bergamo RN, [place]     Essential hypertension 03/13/2008   Lab: Normal CMet in 12/2011 except creatinine of 1.35    FATIGUE 02/25/2009   Qualifier: Diagnosis of  By: Moshe Cipro MD, Margaret     Gout flare 04/28/2014   HAND PAIN, RIGHT 02/25/2009   Qualifier: Diagnosis of  By: Moshe Cipro MD, Margaret     Hyperlipidemia    Hypertension    Metabolic syndrome X 85/27/7824   Obesity    OBESITY 03/13/2008   Qualifier: Diagnosis of  By: Vinetta Bergamo RN, [place]     Pansinusitis 11/23/2018   12/16-12/31/2019 [facility] CT revealed complete opacification of bilateral maxillary/ethmoid sinus and right frontal sinus.Effusion within bilateral middle air spaces ENT recommended antibiotics w/o surgical intervention   Pre-diabetes    Prediabetes 01/08/2012   HBa1C is 6.4 in 04/2014    Renal insufficiency 02/01/2012  Respiratory failure with hypoxia ([place]) 11/23/2018   12/16-12/31/2009 [facility] with hypoxic respiratory failure in the context of strep pneumonia bacteremia (on blood cultures [facility] in [facility] ED) Intubated in [facility] ED and transferred to [facility]     Tachycardia 04/28/2014   Thrombocytosis 11/25/2018   11/16/2018 platelet count 685,000   Vitamin D deficiency    Vitamin D deficiency 01/08/2012    Past Surgical History:  Procedure Laterality Date   TOTAL VAGINAL HYSTERECTOMY      Current Outpatient Medications  Medication Sig Dispense Refill    acetaminophen (TYLENOL) 500 MG tablet Take 1,000 mg by mouth as needed.     calcitRIOL (ROCALTROL) 0.25 MCG capsule Take 1.25 mcg by mouth. Mon, Wed, Fri     carvedilol (COREG) 25 MG tablet Take 25 mg by mouth daily at 6 (six) AM.     cinacalcet (SENSIPAR) 30 MG tablet Take 30 mg by mouth. Mon, Wed, Fri     clopidogrel (PLAVIX) 75 MG tablet Take 75 mg by mouth daily.     Epoetin Alfa (EPOGEN IJ) Inject as directed. 5000 units Mon, Wed, Fri     escitalopram (LEXAPRO) 10 MG tablet Take 10 mg by mouth daily.     NIFEdipine (PROCARDIA XL/ADALAT-CC) 90 MG 24 hr tablet Take 1 tablet (90 mg total) by mouth daily. 30 tablet 3   UNABLE TO FIND Med Name: Venofer 50mg  once a week at dialysis     polyethylene glycol-electrolytes (TRILYTE) 420 g solution Take 4,000 mLs by mouth as directed. 4000 mL 0   No current facility-administered medications for this visit.    Allergies as of 10/28/2021   (No Known Allergies)    Family History  Problem Relation Age of Onset   Heart attack Mother    Heart attack Father    Cancer Sister    Hypertension Sister    Hypertension Brother    Colon cancer Neg Hx     Social History   Socioeconomic History   Marital status: Divorced    Spouse name: Not on file   Number of children: Not on file   Years of education: Not on file   Highest education level: Not on file  Occupational History   Not on file  Tobacco Use   Smoking status: Never   Smokeless tobacco: Never  Vaping Use   Vaping Use: Never used  Substance and Sexual Activity   Alcohol use: No   Drug use: No   Sexual activity: Not on file  Other Topics Concern   Not on file  Social History Narrative   Not on file   Social Determinants of Health   Financial Resource Strain: Not on file  Food Insecurity: Not on file  Transportation Needs: Not on file  Physical Activity: Not on file  Stress: Not on file  Social Connections: Not on file  Intimate Partner Violence: Not on file    Review  of Systems: Gen: Denies any fever, chills, cold or flulike symptoms, presyncope, syncope. CV: Denies chest pain, heart palpitations. Resp: Denies shortness of breath or cough. GI: See HPI GU : Denies urinary burning, urinary frequency, urinary hesitancy MS: Denies joint pain. Derm: Denies rash. Psych: Denies depression, anxiety. Heme: See HPI  Physical Exam: BP (!) 153/86    Pulse 77    Temp (!) 96.9 F (36.1 C) (Temporal)    Ht 5\' 5"  (1.651 m)    Wt 161 lb 12.8 oz (73.4 kg)    BMI 26.92 kg/m  General:  Alert and oriented. Pleasant and cooperative. Well-nourished and well-developed.  Head:  Normocephalic and atraumatic. Eyes:  Without icterus, sclera clear and conjunctiva pink.  Ears:  Normal auditory acuity. Lungs:  Clear to auscultation bilaterally. No wheezes, rales, or rhonchi. No distress.  Heart:  S1, S2 present without murmurs appreciated.  Abdomen:  +BS, soft, non-tender and non-distended. No HSM noted. No guarding or rebound. No masses appreciated.  Rectal:  Deferred  Msk:  Symmetrical without gross deformities. Normal posture. Extremities:  Without edema. Neurologic:  Alert and  oriented x4;  grossly normal neurologically. Skin:  Intact without significant lesions or rashes. Psych: Normal mood and affect.    Assessment: 70 year old female with history of multifocal subacute cerebral infarcts on Plavix, reported history of "heart attack" while hospitalized in 2019/2020 without any intervention, ESRD on dialysis, HTN, HLD, presenting today to discuss scheduling first-ever screening colonoscopy.  She has no significant upper or lower GI symptoms.  She did lose quite a bit of weight when she was quite ill and hospitalized in the latter part of 2019/early 2020; however, her weight has been stable since April 2022. Reports she finally started feeling well after starting dialysis in December last year.   No family history of colon cancer.   Plan: Proceed with colonoscopy with  propofol over the near future. The risks, benefits, and alternatives have been discussed with the patient in detail. The patient states understanding and desires to proceed. ASA 3/IV Hold Plavix x5 days prior to procedure. Follow-up as needed.   Aliene Altes, PA-C [facility]astroenter[facility] 10/28/2021

## 2021-10-27 NOTE — Progress Notes (Deleted)
  The note originally documented on this encounter has been moved the the encounter in which it belongs.  

## 2021-10-28 ENCOUNTER — Other Ambulatory Visit: Payer: Self-pay

## 2021-10-28 ENCOUNTER — Ambulatory Visit (INDEPENDENT_AMBULATORY_CARE_PROVIDER_SITE_OTHER): Payer: Medicare Other | Admitting: Gastroenterology

## 2021-10-28 ENCOUNTER — Encounter: Payer: Self-pay | Admitting: Gastroenterology

## 2021-10-28 VITALS — BP 153/86 | HR 77 | Temp 96.9°F | Ht 65.0 in | Wt 161.8 lb

## 2021-10-28 DIAGNOSIS — Z1211 Encounter for screening for malignant neoplasm of colon: Secondary | ICD-10-CM

## 2021-10-28 MED ORDER — PEG 3350-KCL-NA BICARB-NACL 420 G PO SOLR: 4000.0000 mL | ORAL | 0 refills | Status: DC

## 2021-10-28 NOTE — Patient Instructions (Signed)
We will arrange for you to have a colonoscopy in the near future with Dr. Abbey Chatters.  You will need to hold Plavix for 5 days prior to your procedure.  We will plan to see you back in the office as needed.  Do not hesitate to call if you have any new GI concerns.   It was a pleasure meeting you today!  I hope you have a very Merry Christmas!  Aliene Altes, PA-C [facility]

## 2021-10-29 DIAGNOSIS — N2581 Secondary hyperparathyroidism of renal origin: Secondary | ICD-10-CM | POA: Diagnosis not present

## 2021-10-29 DIAGNOSIS — N186 End stage renal disease: Secondary | ICD-10-CM | POA: Diagnosis not present

## 2021-10-29 DIAGNOSIS — Z992 Dependence on renal dialysis: Secondary | ICD-10-CM | POA: Diagnosis not present

## 2021-10-29 DIAGNOSIS — D509 Iron deficiency anemia, unspecified: Secondary | ICD-10-CM | POA: Diagnosis not present

## 2021-10-29 DIAGNOSIS — D631 Anemia in chronic kidney disease: Secondary | ICD-10-CM | POA: Diagnosis not present

## 2021-11-01 DIAGNOSIS — D509 Iron deficiency anemia, unspecified: Secondary | ICD-10-CM | POA: Diagnosis not present

## 2021-11-01 DIAGNOSIS — Z992 Dependence on renal dialysis: Secondary | ICD-10-CM | POA: Diagnosis not present

## 2021-11-01 DIAGNOSIS — N2581 Secondary hyperparathyroidism of renal origin: Secondary | ICD-10-CM | POA: Diagnosis not present

## 2021-11-01 DIAGNOSIS — N186 End stage renal disease: Secondary | ICD-10-CM | POA: Diagnosis not present

## 2021-11-01 DIAGNOSIS — D631 Anemia in chronic kidney disease: Secondary | ICD-10-CM | POA: Diagnosis not present

## 2021-11-03 ENCOUNTER — Other Ambulatory Visit: Payer: Self-pay

## 2021-11-03 ENCOUNTER — Other Ambulatory Visit (HOSPITAL_COMMUNITY): Payer: Self-pay | Admitting: Family Medicine

## 2021-11-03 ENCOUNTER — Ambulatory Visit (HOSPITAL_COMMUNITY)
Admission: RE | Admit: 2021-11-03 | Discharge: 2021-11-03 | Disposition: A | Discharge status: Home / Self Care | Payer: Medicare Other | Source: Ambulatory Visit | Attending: Family Medicine | Admitting: Family Medicine

## 2021-11-03 DIAGNOSIS — N6011 Diffuse cystic mastopathy of right breast: Secondary | ICD-10-CM | POA: Diagnosis not present

## 2021-11-03 DIAGNOSIS — R928 Other abnormal and inconclusive findings on diagnostic imaging of breast: Secondary | ICD-10-CM

## 2021-11-03 DIAGNOSIS — D509 Iron deficiency anemia, unspecified: Secondary | ICD-10-CM | POA: Diagnosis not present

## 2021-11-03 DIAGNOSIS — D631 Anemia in chronic kidney disease: Secondary | ICD-10-CM | POA: Diagnosis not present

## 2021-11-03 DIAGNOSIS — Z992 Dependence on renal dialysis: Secondary | ICD-10-CM | POA: Diagnosis not present

## 2021-11-03 DIAGNOSIS — N186 End stage renal disease: Secondary | ICD-10-CM | POA: Diagnosis not present

## 2021-11-03 DIAGNOSIS — R922 Inconclusive mammogram: Secondary | ICD-10-CM | POA: Diagnosis not present

## 2021-11-03 DIAGNOSIS — N2581 Secondary hyperparathyroidism of renal origin: Secondary | ICD-10-CM | POA: Diagnosis not present

## 2021-11-04 ENCOUNTER — Ambulatory Visit: Payer: Medicare Other | Admitting: Cardiology

## 2021-11-04 NOTE — Progress Notes (Deleted)
Clinical Summary Angel Davis is a 70 y.o.female seen today as a new consult, referred by PA Glennon Mac for the following medical problems.  1.ESRD - being considered for transplant     Past Medical History:  Diagnosis Date   Abnormal EKG    Acute gout 06/29/2014   AKI (acute kidney injury) ([place]) 11/23/2018   12/16-12/31/2019 [place] : peak Creatinine 4.39   Anemia    H&H of 10.3/33.5 in 12/2011   Cerebral infarction [facility]) 11/23/2018   12/16-12/31/2019 [facility] MRI performed for encephalopathy in the setting of strep pneumoniae bacteremia and acute hypoxic respiratory failure with findings of multifocal subacute infarcts Plavix and high dose statin initiated   CKD (chronic kidney disease)    On dialysis M, W, F   Depression    DEPRESSION 03/13/2008   Qualifier: Diagnosis of  By: Vinetta Bergamo RN, [place]     Essential hypertension 03/13/2008   Lab: Normal CMet in 12/2011 except creatinine of 1.35    FATIGUE 02/25/2009   Qualifier: Diagnosis of  By: Moshe Cipro MD, Margaret     Gout flare 04/28/2014   HAND PAIN, RIGHT 02/25/2009   Qualifier: Diagnosis of  By: Moshe Cipro MD, Margaret     Hyperlipidemia    Hypertension    Metabolic syndrome X 93/90/3009   Obesity    OBESITY 03/13/2008   Qualifier: Diagnosis of  By: Vinetta Bergamo RN, [place]     Pansinusitis 11/23/2018   12/16-12/31/2019 [facility] CT revealed complete opacification of bilateral maxillary/ethmoid sinus and right frontal sinus.Effusion within bilateral middle air spaces ENT recommended antibiotics w/o surgical intervention   Pre-diabetes    Prediabetes 01/08/2012   HBa1C is 6.4 in 04/2014    Renal insufficiency 02/01/2012   Respiratory failure with hypoxia [facility]) 11/23/2018   12/16-12/31/2009 [facility] with hypoxic respiratory failure in the context of strep pneumonia bacteremia (on blood cultures collected in [place] ED) Intubated in [facility] ED and transferred to [facility]     Tachycardia 04/28/2014   Thrombocytosis 11/25/2018   11/16/2018  platelet count 685,000   Vitamin D deficiency    Vitamin D deficiency 01/08/2012     No Known Allergies   Current Outpatient Medications  Medication Sig Dispense Refill   acetaminophen (TYLENOL) 500 MG tablet Take 1,000 mg by mouth as needed.     calcitRIOL (ROCALTROL) 0.25 MCG capsule Take 1.25 mcg by mouth. Mon, Wed, Fri     carvedilol (COREG) 25 MG tablet Take 25 mg by mouth daily at 6 (six) AM.     cinacalcet (SENSIPAR) 30 MG tablet Take 30 mg by mouth. Mon, Wed, Fri     clopidogrel (PLAVIX) 75 MG tablet Take 75 mg by mouth daily.     Epoetin Alfa (EPOGEN IJ) Inject as directed. 5000 units Mon, Wed, Fri     escitalopram (LEXAPRO) 10 MG tablet Take 10 mg by mouth daily.     NIFEdipine (PROCARDIA XL/ADALAT-CC) 90 MG 24 hr tablet Take 1 tablet (90 mg total) by mouth daily. 30 tablet 3   polyethylene glycol-electrolytes (TRILYTE) 420 g solution Take 4,000 mLs by mouth as directed. 4000 mL 0   UNABLE TO FIND Med Name: Venofer 50mg  once a week at dialysis     No current facility-administered medications for this visit.     Past Surgical History:  Procedure Laterality Date   TOTAL VAGINAL HYSTERECTOMY       No Known Allergies    Family History  Problem Relation Age of Onset   Heart attack Mother  Heart attack Father    Cancer Sister    Hypertension Sister    Hypertension Brother    Colon cancer Neg Hx      Social History Ms. Kipnis reports that she has never smoked. She has never used smokeless tobacco. Ms. Maready reports no history of alcohol use.   Review of Systems CONSTITUTIONAL: No weight loss, fever, chills, weakness or fatigue.  HEENT: Eyes: No visual loss, blurred vision, double vision or yellow sclerae.No hearing loss, sneezing, congestion, runny nose or sore throat.  SKIN: No rash or itching.  CARDIOVASCULAR:  RESPIRATORY: No shortness of breath, cough or sputum.  GASTROINTESTINAL: No anorexia, nausea, vomiting or diarrhea. No abdominal pain or  blood.  GENITOURINARY: No burning on urination, no polyuria NEUROLOGICAL: No headache, dizziness, syncope, paralysis, ataxia, numbness or tingling in the extremities. No change in bowel or bladder control.  MUSCULOSKELETAL: No muscle, back pain, joint pain or stiffness.  LYMPHATICS: No enlarged nodes. No history of splenectomy.  PSYCHIATRIC: No history of depression or anxiety.  ENDOCRINOLOGIC: No reports of sweating, cold or heat intolerance. No polyuria or polydipsia.  Marland Kitchen   Physical Examination There were no vitals filed for this visit. There were no vitals filed for this visit.  Gen: resting comfortably, no acute distress HEENT: no scleral icterus, pupils equal round and reactive, no palptable cervical adenopathy,  CV Resp: Clear to auscultation bilaterally GI: abdomen is soft, non-tender, non-distended, normal bowel sounds, no hepatosplenomegaly MSK: extremities are warm, no edema.  Skin: warm, no rash Neuro:  no focal deficits Psych: appropriate affect   Diagnostic Studies  2013 echo Study Conclusions   - Left ventricle: The cavity size was normal. Wall thickness    was increased in a pattern of mild LVH. Systolic function    was normal. The estimated ejection fraction was in the    range of 60% to 65%. Wall motion was normal; there were no    regional wall motion abnormalities. Doppler parameters are    consistent with abnormal left ventricular relaxation    (grade 1 diastolic dysfunction).  - Aortic valve: Trileaflet; mildly thickened leaflets.    Trivial regurgitation.  - Mitral valve: Calcified annulus. Mild regurgitation.  - Right ventricle: The cavity size was mildly dilated.  - Atrial septum: The septum bowed from right to left,    consistent with increased right atrial pressure. There was    a possible patent foramen ovale.  - Tricuspid valve: Trivial regurgitation.  - Pericardium, extracardiac: There was no pericardial    effusion.   2019 echo  [facility] SUMMARY  Limited for Bubble Study  The left ventricular size is normal.   Left ventricular systolic function is normal.  LV ejection fraction = 55-60%.  The right ventricle is normal in size and function.  Injection of agitated saline showed no right-to-left shunt.  There is no pericardial effusion.  -     Assessment and Plan        Arnoldo Lenis, M.D., F.A.C.C.

## 2021-11-05 DIAGNOSIS — D631 Anemia in chronic kidney disease: Secondary | ICD-10-CM | POA: Diagnosis not present

## 2021-11-05 DIAGNOSIS — D509 Iron deficiency anemia, unspecified: Secondary | ICD-10-CM | POA: Diagnosis not present

## 2021-11-05 DIAGNOSIS — N186 End stage renal disease: Secondary | ICD-10-CM | POA: Diagnosis not present

## 2021-11-05 DIAGNOSIS — N2581 Secondary hyperparathyroidism of renal origin: Secondary | ICD-10-CM | POA: Diagnosis not present

## 2021-11-05 DIAGNOSIS — Z992 Dependence on renal dialysis: Secondary | ICD-10-CM | POA: Diagnosis not present

## 2021-11-08 DIAGNOSIS — D509 Iron deficiency anemia, unspecified: Secondary | ICD-10-CM | POA: Diagnosis not present

## 2021-11-08 DIAGNOSIS — N186 End stage renal disease: Secondary | ICD-10-CM | POA: Diagnosis not present

## 2021-11-08 DIAGNOSIS — D631 Anemia in chronic kidney disease: Secondary | ICD-10-CM | POA: Diagnosis not present

## 2021-11-08 DIAGNOSIS — Z992 Dependence on renal dialysis: Secondary | ICD-10-CM | POA: Diagnosis not present

## 2021-11-08 DIAGNOSIS — N2581 Secondary hyperparathyroidism of renal origin: Secondary | ICD-10-CM | POA: Diagnosis not present

## 2021-11-10 DIAGNOSIS — Z992 Dependence on renal dialysis: Secondary | ICD-10-CM | POA: Diagnosis not present

## 2021-11-10 DIAGNOSIS — D631 Anemia in chronic kidney disease: Secondary | ICD-10-CM | POA: Diagnosis not present

## 2021-11-10 DIAGNOSIS — N186 End stage renal disease: Secondary | ICD-10-CM | POA: Diagnosis not present

## 2021-11-10 DIAGNOSIS — D509 Iron deficiency anemia, unspecified: Secondary | ICD-10-CM | POA: Diagnosis not present

## 2021-11-10 DIAGNOSIS — N2581 Secondary hyperparathyroidism of renal origin: Secondary | ICD-10-CM | POA: Diagnosis not present

## 2021-11-12 ENCOUNTER — Encounter (HOSPITAL_COMMUNITY)
Admission: RE | Admit: 2021-11-12 | Discharge: 2021-11-12 | Disposition: A | Discharge status: Home / Self Care | Payer: Medicare Other | Source: Ambulatory Visit | Attending: Internal Medicine | Admitting: Internal Medicine

## 2021-11-12 ENCOUNTER — Other Ambulatory Visit: Payer: Self-pay

## 2021-11-12 ENCOUNTER — Encounter (HOSPITAL_COMMUNITY): Payer: Self-pay

## 2021-11-12 DIAGNOSIS — D509 Iron deficiency anemia, unspecified: Secondary | ICD-10-CM | POA: Diagnosis not present

## 2021-11-12 DIAGNOSIS — Z992 Dependence on renal dialysis: Secondary | ICD-10-CM | POA: Diagnosis not present

## 2021-11-12 DIAGNOSIS — N2581 Secondary hyperparathyroidism of renal origin: Secondary | ICD-10-CM | POA: Diagnosis not present

## 2021-11-12 DIAGNOSIS — N186 End stage renal disease: Secondary | ICD-10-CM | POA: Diagnosis not present

## 2021-11-12 DIAGNOSIS — D631 Anemia in chronic kidney disease: Secondary | ICD-10-CM | POA: Diagnosis not present

## 2021-11-15 DIAGNOSIS — Z992 Dependence on renal dialysis: Secondary | ICD-10-CM | POA: Diagnosis not present

## 2021-11-15 DIAGNOSIS — N2581 Secondary hyperparathyroidism of renal origin: Secondary | ICD-10-CM | POA: Diagnosis not present

## 2021-11-15 DIAGNOSIS — N186 End stage renal disease: Secondary | ICD-10-CM | POA: Diagnosis not present

## 2021-11-15 DIAGNOSIS — D509 Iron deficiency anemia, unspecified: Secondary | ICD-10-CM | POA: Diagnosis not present

## 2021-11-15 DIAGNOSIS — D631 Anemia in chronic kidney disease: Secondary | ICD-10-CM | POA: Diagnosis not present

## 2021-11-16 ENCOUNTER — Other Ambulatory Visit: Payer: Self-pay

## 2021-11-16 ENCOUNTER — Other Ambulatory Visit (HOSPITAL_COMMUNITY): Payer: Self-pay | Admitting: Family Medicine

## 2021-11-16 ENCOUNTER — Ambulatory Visit (HOSPITAL_COMMUNITY)
Admission: RE | Admit: 2021-11-16 | Discharge: 2021-11-16 | Disposition: A | Discharge status: Home / Self Care | Payer: Medicare Other | Source: Ambulatory Visit | Attending: Family Medicine | Admitting: Family Medicine

## 2021-11-16 ENCOUNTER — Encounter (HOSPITAL_COMMUNITY): Payer: Self-pay

## 2021-11-16 DIAGNOSIS — N6001 Solitary cyst of right breast: Secondary | ICD-10-CM | POA: Insufficient documentation

## 2021-11-16 DIAGNOSIS — Z7902 Long term (current) use of antithrombotics/antiplatelets: Secondary | ICD-10-CM | POA: Diagnosis not present

## 2021-11-16 DIAGNOSIS — N6311 Unspecified lump in the right breast, upper outer quadrant: Secondary | ICD-10-CM | POA: Insufficient documentation

## 2021-11-16 DIAGNOSIS — K5730 Diverticulosis of large intestine without perforation or abscess without bleeding: Secondary | ICD-10-CM | POA: Diagnosis not present

## 2021-11-16 DIAGNOSIS — N6310 Unspecified lump in the right breast, unspecified quadrant: Secondary | ICD-10-CM

## 2021-11-16 DIAGNOSIS — Z1211 Encounter for screening for malignant neoplasm of colon: Secondary | ICD-10-CM | POA: Diagnosis not present

## 2021-11-16 DIAGNOSIS — R928 Other abnormal and inconclusive findings on diagnostic imaging of breast: Secondary | ICD-10-CM | POA: Insufficient documentation

## 2021-11-16 DIAGNOSIS — Z8673 Personal history of transient ischemic attack (TIA), and cerebral infarction without residual deficits: Secondary | ICD-10-CM | POA: Diagnosis not present

## 2021-11-16 DIAGNOSIS — N186 End stage renal disease: Secondary | ICD-10-CM | POA: Diagnosis not present

## 2021-11-16 DIAGNOSIS — D124 Benign neoplasm of descending colon: Secondary | ICD-10-CM | POA: Diagnosis not present

## 2021-11-16 DIAGNOSIS — K648 Other hemorrhoids: Secondary | ICD-10-CM | POA: Diagnosis not present

## 2021-11-16 DIAGNOSIS — D123 Benign neoplasm of transverse colon: Secondary | ICD-10-CM | POA: Diagnosis not present

## 2021-11-16 DIAGNOSIS — I252 Old myocardial infarction: Secondary | ICD-10-CM | POA: Diagnosis not present

## 2021-11-16 DIAGNOSIS — R7303 Prediabetes: Secondary | ICD-10-CM | POA: Diagnosis not present

## 2021-11-16 DIAGNOSIS — I120 Hypertensive chronic kidney disease with stage 5 chronic kidney disease or end stage renal disease: Secondary | ICD-10-CM | POA: Diagnosis not present

## 2021-11-16 DIAGNOSIS — Z992 Dependence on renal dialysis: Secondary | ICD-10-CM | POA: Diagnosis not present

## 2021-11-16 HISTORY — PX: BREAST BIOPSY: SHX20

## 2021-11-16 MED ORDER — LIDOCAINE HCL (PF) 2 % IJ SOLN
INTRAMUSCULAR | Status: AC
  Administered 2021-11-16: 10:00:00 20 mL
  Filled 2021-11-16: qty 20

## 2021-11-16 NOTE — Progress Notes (Signed)
PT tolerated right breast biopsies well today with NAD noted. PT verbalized understanding of discharge instructions. PT ambulated back to the mammogram area this time and given ice packs. Samples taken to lab at this time by ultrasound tech for processing.

## 2021-11-17 ENCOUNTER — Telehealth: Payer: Self-pay | Admitting: *Deleted

## 2021-11-17 DIAGNOSIS — N186 End stage renal disease: Secondary | ICD-10-CM | POA: Diagnosis not present

## 2021-11-17 DIAGNOSIS — N2581 Secondary hyperparathyroidism of renal origin: Secondary | ICD-10-CM | POA: Diagnosis not present

## 2021-11-17 DIAGNOSIS — Z992 Dependence on renal dialysis: Secondary | ICD-10-CM | POA: Diagnosis not present

## 2021-11-17 DIAGNOSIS — D631 Anemia in chronic kidney disease: Secondary | ICD-10-CM | POA: Diagnosis not present

## 2021-11-17 DIAGNOSIS — D509 Iron deficiency anemia, unspecified: Secondary | ICD-10-CM | POA: Diagnosis not present

## 2021-11-17 LAB — SURGICAL PATHOLOGY

## 2021-11-17 NOTE — Telephone Encounter (Signed)
Called pt to see if she can move up on procedure schedule for tomorrow but no answer.

## 2021-11-18 ENCOUNTER — Ambulatory Visit (HOSPITAL_COMMUNITY): Payer: Medicare Other | Admitting: Anesthesiology

## 2021-11-18 ENCOUNTER — Encounter (HOSPITAL_COMMUNITY): Payer: Self-pay

## 2021-11-18 ENCOUNTER — Other Ambulatory Visit: Payer: Self-pay

## 2021-11-18 ENCOUNTER — Encounter (HOSPITAL_COMMUNITY)
Admission: RE | Disposition: A | Discharge status: Home / Self Care | Payer: Self-pay | Source: Home / Self Care | Attending: Internal Medicine

## 2021-11-18 ENCOUNTER — Ambulatory Visit (HOSPITAL_COMMUNITY)
Admission: RE | Admit: 2021-11-18 | Discharge: 2021-11-18 | Disposition: A | Discharge status: Home / Self Care | Payer: Medicare Other | Attending: Internal Medicine | Admitting: Internal Medicine

## 2021-11-18 DIAGNOSIS — D124 Benign neoplasm of descending colon: Secondary | ICD-10-CM | POA: Diagnosis not present

## 2021-11-18 DIAGNOSIS — Z1211 Encounter for screening for malignant neoplasm of colon: Secondary | ICD-10-CM | POA: Diagnosis not present

## 2021-11-18 DIAGNOSIS — K5730 Diverticulosis of large intestine without perforation or abscess without bleeding: Secondary | ICD-10-CM | POA: Insufficient documentation

## 2021-11-18 DIAGNOSIS — Z7902 Long term (current) use of antithrombotics/antiplatelets: Secondary | ICD-10-CM | POA: Diagnosis not present

## 2021-11-18 DIAGNOSIS — I120 Hypertensive chronic kidney disease with stage 5 chronic kidney disease or end stage renal disease: Secondary | ICD-10-CM | POA: Diagnosis not present

## 2021-11-18 DIAGNOSIS — Z8673 Personal history of transient ischemic attack (TIA), and cerebral infarction without residual deficits: Secondary | ICD-10-CM | POA: Diagnosis not present

## 2021-11-18 DIAGNOSIS — D123 Benign neoplasm of transverse colon: Secondary | ICD-10-CM

## 2021-11-18 DIAGNOSIS — I252 Old myocardial infarction: Secondary | ICD-10-CM | POA: Diagnosis not present

## 2021-11-18 DIAGNOSIS — K648 Other hemorrhoids: Secondary | ICD-10-CM | POA: Insufficient documentation

## 2021-11-18 DIAGNOSIS — R7303 Prediabetes: Secondary | ICD-10-CM | POA: Diagnosis not present

## 2021-11-18 DIAGNOSIS — K635 Polyp of colon: Secondary | ICD-10-CM | POA: Diagnosis not present

## 2021-11-18 DIAGNOSIS — N186 End stage renal disease: Secondary | ICD-10-CM | POA: Insufficient documentation

## 2021-11-18 DIAGNOSIS — Z992 Dependence on renal dialysis: Secondary | ICD-10-CM | POA: Insufficient documentation

## 2021-11-18 HISTORY — PX: COLONOSCOPY WITH PROPOFOL: SHX5780

## 2021-11-18 HISTORY — PX: POLYPECTOMY: SHX5525

## 2021-11-18 LAB — POCT I-STAT, CHEM 8
BUN: 25 mg/dL — ABNORMAL HIGH (ref 8–23)
Calcium, Ion: 0.84 mmol/L — CL (ref 1.15–1.40)
Chloride: 106 mmol/L (ref 98–111)
Creatinine, Ser: 7.5 mg/dL — ABNORMAL HIGH (ref 0.44–1.00)
Glucose, Bld: 80 mg/dL (ref 70–99)
HCT: 43 % (ref 36.0–46.0)
Hemoglobin: 14.6 g/dL (ref 12.0–15.0)
Potassium: 4 mmol/L (ref 3.5–5.1)
Sodium: 136 mmol/L (ref 135–145)
TCO2: 23 mmol/L (ref 22–32)

## 2021-11-18 SURGERY — COLONOSCOPY WITH PROPOFOL
Anesthesia: General

## 2021-11-18 MED ORDER — PROPOFOL 10 MG/ML IV BOLUS
INTRAVENOUS | Status: DC | PRN
  Administered 2021-11-18 (×2): 20 mg via INTRAVENOUS
  Administered 2021-11-18: 50 mg via INTRAVENOUS

## 2021-11-18 MED ORDER — LACTATED RINGERS IV SOLN
INTRAVENOUS | Status: DC | PRN
  Administered 2021-11-18: 1000 mL via INTRAVENOUS

## 2021-11-18 MED ORDER — PROPOFOL 10 MG/ML IV BOLUS
INTRAVENOUS | Status: AC
  Filled 2021-11-18: qty 20

## 2021-11-18 NOTE — Interval H&P Note (Signed)
History and Physical Interval Note:  11/18/2021 12:23 PM  Angel Davis  has presented today for surgery, with the diagnosis of colon cancer screening.  The various methods of treatment have been discussed with the patient and family. After consideration of risks, benefits and other options for treatment, the patient has consented to  Procedure(s) with comments: COLONOSCOPY WITH PROPOFOL (N/A) - 2:00pm as a surgical intervention.  The patient's history has been reviewed, patient examined, no change in status, stable for surgery.  I have reviewed the patient's chart and labs.  Questions were answered to the patient's satisfaction.     Eloise Harman

## 2021-11-18 NOTE — Discharge Instructions (Signed)
°  Colonoscopy Discharge Instructions  Read the instructions outlined below and refer to this sheet in the next few weeks. These discharge instructions provide you with general information on caring for yourself after you leave the hospital. Your doctor may also give you specific instructions. While your treatment has been planned according to the most current medical practices available, unavoidable complications occasionally occur.   ACTIVITY You may resume your regular activity, but move at a slower pace for the next 24 hours.  Take frequent rest periods for the next 24 hours.  Walking will help get rid of the air and reduce the bloated feeling in your belly (abdomen).  No driving for 24 hours (because of the medicine (anesthesia) used during the test).   Do not sign any important legal documents or operate any machinery for 24 hours (because of the anesthesia used during the test).  NUTRITION Drink plenty of fluids.  You may resume your normal diet as instructed by your doctor.  Begin with a light meal and progress to your normal diet. Heavy or fried foods are harder to digest and may make you feel sick to your stomach (nauseated).  Avoid alcoholic beverages for 24 hours or as instructed.  MEDICATIONS You may resume your normal medications unless your doctor tells you otherwise.  WHAT YOU CAN EXPECT TODAY Some feelings of bloating in the abdomen.  Passage of more gas than usual.  Spotting of blood in your stool or on the toilet paper.  IF YOU HAD POLYPS REMOVED DURING THE COLONOSCOPY: No aspirin products for 7 days or as instructed.  No alcohol for 7 days or as instructed.  Eat a soft diet for the next 24 hours.  FINDING OUT THE RESULTS OF YOUR TEST Not all test results are available during your visit. If your test results are not back during the visit, make an appointment with your caregiver to find out the results. Do not assume everything is normal if you have not heard from your  caregiver or the medical facility. It is important for you to follow up on all of your test results.  SEEK IMMEDIATE MEDICAL ATTENTION IF: You have more than a spotting of blood in your stool.  Your belly is swollen (abdominal distention).  You are nauseated or vomiting.  You have a temperature over 101.  You have abdominal pain or discomfort that is severe or gets worse throughout the day.   Your colonoscopy revealed 2 polyp(s) which I removed successfully. Await pathology results, my office will contact you. I recommend repeating colonoscopy in 5 years for surveillance purposes.   You also have diverticulosis and internal hemorrhoids. I would recommend increasing fiber in your diet or adding OTC Benefiber/Metamucil. Be sure to drink at least 4 to 6 glasses of water daily. Follow-up with GI as needed.   I hope you have a great rest of your week!  Malon Siddall K. Caylor Cerino, D.O. Gastroenterology and Hepatology [facility]  

## 2021-11-18 NOTE — Anesthesia Preprocedure Evaluation (Signed)
Anesthesia Evaluation  Patient identified by MRN, date of birth, ID band Patient awake    Reviewed: Allergy & Precautions, H&P , NPO status , Patient's Chart, lab work & pertinent test results, reviewed documented beta blocker date and time   Airway Mallampati: II  TM Distance: >3 FB Neck ROM: full    Dental no notable dental hx.    Pulmonary neg pulmonary ROS,    Pulmonary exam normal breath sounds clear to auscultation       Cardiovascular Exercise Tolerance: Good hypertension, negative cardio ROS   Rhythm:regular Rate:Normal     Neuro/Psych PSYCHIATRIC DISORDERS Depression negative neurological ROS     GI/Hepatic negative GI ROS, Neg liver ROS,   Endo/Other  negative endocrine ROS  Renal/GU CRF, ESRF and DialysisRenal disease  negative genitourinary   Musculoskeletal   Abdominal   Peds  Hematology  (+) Blood dyscrasia, anemia ,   Anesthesia Other Findings   Reproductive/Obstetrics negative OB ROS                             Anesthesia Physical Anesthesia Plan  ASA: 3  Anesthesia Plan: General   Post-op Pain Management:    Induction:   PONV Risk Score and Plan: Propofol infusion  Airway Management Planned:   Additional Equipment:   Intra-op Plan:   Post-operative Plan:   Informed Consent: I have reviewed the patients History and Physical, chart, labs and discussed the procedure including the risks, benefits and alternatives for the proposed anesthesia with the patient or authorized representative who has indicated his/her understanding and acceptance.     Dental Advisory Given  Plan Discussed with: CRNA  Anesthesia Plan Comments:         Anesthesia Quick Evaluation

## 2021-11-18 NOTE — Anesthesia Postprocedure Evaluation (Signed)
Anesthesia Post Note  Patient: Angel Davis  Procedure(s) Performed: COLONOSCOPY WITH PROPOFOL POLYPECTOMY  Patient location during evaluation: Short Stay Anesthesia Type: General Level of consciousness: awake and alert Pain management: pain level controlled Vital Signs Assessment: post-procedure vital signs reviewed and stable Respiratory status: spontaneous breathing Cardiovascular status: blood pressure returned to baseline and stable Postop Assessment: no apparent nausea or vomiting Anesthetic complications: no   No notable events documented.   Last Vitals:  Vitals:   11/18/21 1121 11/18/21 1259  BP: 136/60 (!) 138/56  Pulse: 69 69  Resp: 13 18  Temp: 37.1 C 36.6 C  SpO2: 96% 95%    Last Pain:  Vitals:   11/18/21 1259  TempSrc: Oral  PainSc: Asleep                 Camyla Camposano

## 2021-11-18 NOTE — Op Note (Signed)
[facility] Patient Name: Angel Davis Procedure Date: 11/18/2021 12:20 PM MRN: [id number] Date of Birth: 12-16-1950 Attending MD: Elon Alas. Abbey Chatters DO CSN: [id number] Age: 70 Admit Type: Outpatient Procedure:                Colonoscopy Indications:              Screening for colorectal malignant neoplasm Providers:                Elon Alas. Abbey Chatters, DO, Hughie Closs, RN, Janeece Riggers, RN, Randa Spike, Technician Referring MD:              Medicines:                See the Anesthesia note for documentation of the                            administered medications Complications:            No immediate complications. Estimated Blood Loss:     Estimated blood loss was minimal. Procedure:                Pre-Anesthesia Assessment:                           - The anesthesia plan was to use monitored                            anesthesia care (MAC).                           After obtaining informed consent, the colonoscope                            was passed under direct vision. Throughout the                            procedure, the patient's blood pressure, pulse, and                            oxygen saturations were monitored continuously. The                            [id number]-HQ190L ([id number]) scope was introduced through                            the anus and advanced to the the cecum, identified                            by appendiceal orifice and ileocecal valve. The                            colonoscopy was performed without difficulty. The                            patient tolerated the  procedure well. The quality                            of the bowel preparation was evaluated using the                            BBPS [facility] Bowel Preparation Scale) with scores                            of: Right Colon = 3, Transverse Colon = 3 and Left                            Colon = 3 (entire mucosa seen well with no residual                             staining, small fragments of stool or opaque                            liquid). The total BBPS score equals 9. Scope In: 12:40:26 PM Scope Out: 12:51:35 PM Scope Withdrawal Time: 0 hours 7 minutes 54 seconds  Total Procedure Duration: 0 hours 11 minutes 9 seconds  Findings:      The perianal and digital rectal examinations were normal.      Non-bleeding internal hemorrhoids were found during endoscopy.      Multiple small and large-mouthed diverticula were found in the sigmoid       colon and descending colon.      A 5 mm polyp was found in the transverse colon. The polyp was sessile.       The polyp was removed with a cold snare. Resection and retrieval were       complete.      A 9 mm polyp was found in the descending colon. The polyp was sessile.       The polyp was removed with a cold snare. Resection and retrieval were       complete. Impression:               - Non-bleeding internal hemorrhoids.                           - Diverticulosis in the sigmoid colon and in the                            descending colon.                           - One 5 mm polyp in the transverse colon, removed                            with a cold snare. Resected and retrieved.                           - One 9 mm polyp in the descending colon, removed  with a cold snare. Resected and retrieved. Moderate Sedation:      Per Anesthesia Care Recommendation:           - Patient has a contact number available for                            emergencies. The signs and symptoms of potential                            delayed complications were discussed with the                            patient. Return to normal activities tomorrow.                            Written discharge instructions were provided to the                            patient.                           - Resume previous diet.                           - Continue present medications.                            - Await pathology results.                           - Repeat colonoscopy in 5 years for surveillance.                           - Return to GI clinic PRN. Procedure Code(s):        --- Professional ---                           [phone], Colonoscopy, flexible; with removal of                            tumor(s), polyp(s), or other lesion(s) by snare                            technique Diagnosis Code(s):        --- Professional ---                           Z12.11, Encounter for screening for malignant                            neoplasm of colon                           K64.8, Other hemorrhoids                           K63.5, Polyp of colon  K57.30, Diverticulosis of large intestine without                            perforation or abscess without bleeding CPT copyright 2019 American Medical Association. All rights reserved. The codes documented in this report are preliminary and upon coder review may  be revised to meet current compliance requirements. Elon Alas. Abbey Chatters, DO [place] Abbey Chatters, DO 11/18/2021 12:54:27 PM This report has been signed electronically. Number of Addenda: 0

## 2021-11-18 NOTE — Transfer of Care (Signed)
Immediate Anesthesia Transfer of Care Note  Patient: Angel Davis  Procedure(s) Performed: COLONOSCOPY WITH PROPOFOL POLYPECTOMY  Patient Location: Short Stay  Anesthesia Type:General  Level of Consciousness: awake  Airway & Oxygen Therapy: Patient Spontanous Breathing  Post-op Assessment: Report given to RN  Post vital signs: Reviewed and stable  Last Vitals:  Vitals Value Taken Time  BP 138/56 11/18/21 1259  Temp 36.6 C 11/18/21 1259  Pulse 69 11/18/21 1259  Resp 18 11/18/21 1259  SpO2 95 % 11/18/21 1259    Last Pain:  Vitals:   11/18/21 1259  TempSrc: Oral  PainSc: Asleep         Complications: No notable events documented.

## 2021-11-19 DIAGNOSIS — Z992 Dependence on renal dialysis: Secondary | ICD-10-CM | POA: Diagnosis not present

## 2021-11-19 DIAGNOSIS — N2581 Secondary hyperparathyroidism of renal origin: Secondary | ICD-10-CM | POA: Diagnosis not present

## 2021-11-19 DIAGNOSIS — N186 End stage renal disease: Secondary | ICD-10-CM | POA: Diagnosis not present

## 2021-11-19 DIAGNOSIS — D509 Iron deficiency anemia, unspecified: Secondary | ICD-10-CM | POA: Diagnosis not present

## 2021-11-19 DIAGNOSIS — D631 Anemia in chronic kidney disease: Secondary | ICD-10-CM | POA: Diagnosis not present

## 2021-11-19 LAB — SURGICAL PATHOLOGY

## 2021-11-20 DIAGNOSIS — Z992 Dependence on renal dialysis: Secondary | ICD-10-CM | POA: Diagnosis not present

## 2021-11-20 DIAGNOSIS — N186 End stage renal disease: Secondary | ICD-10-CM | POA: Diagnosis not present

## 2021-11-22 LAB — AEROBIC/ANAEROBIC CULTURE W GRAM STAIN (SURGICAL/DEEP WOUND)
Culture: NO GROWTH
Gram Stain: NONE SEEN

## 2021-11-23 ENCOUNTER — Other Ambulatory Visit (HOSPITAL_COMMUNITY): Payer: Self-pay | Admitting: Family Medicine

## 2021-11-23 ENCOUNTER — Other Ambulatory Visit: Payer: Self-pay | Admitting: Family Medicine

## 2021-11-24 ENCOUNTER — Other Ambulatory Visit (HOSPITAL_COMMUNITY): Payer: Self-pay | Admitting: Family Medicine

## 2021-11-24 ENCOUNTER — Other Ambulatory Visit: Payer: Self-pay | Admitting: Family Medicine

## 2021-11-24 DIAGNOSIS — N6310 Unspecified lump in the right breast, unspecified quadrant: Secondary | ICD-10-CM

## 2021-11-24 DIAGNOSIS — R928 Other abnormal and inconclusive findings on diagnostic imaging of breast: Secondary | ICD-10-CM

## 2021-11-25 ENCOUNTER — Encounter (HOSPITAL_COMMUNITY): Payer: Self-pay | Admitting: Internal Medicine

## 2021-12-01 ENCOUNTER — Inpatient Hospital Stay: Admission: RE | Admit: 2021-12-01 | Payer: BC Managed Care – PPO | Source: Ambulatory Visit

## 2021-12-13 ENCOUNTER — Ambulatory Visit: Payer: Medicare Other | Admitting: Cardiology

## 2022-01-13 ENCOUNTER — Encounter: Payer: Self-pay | Admitting: *Deleted

## 2022-01-18 ENCOUNTER — Ambulatory Visit: Payer: Self-pay | Admitting: Cardiology

## 2022-01-19 ENCOUNTER — Encounter (INDEPENDENT_AMBULATORY_CARE_PROVIDER_SITE_OTHER): Payer: Self-pay | Admitting: *Deleted

## 2022-04-22 DIAGNOSIS — Z992 Dependence on renal dialysis: Secondary | ICD-10-CM | POA: Diagnosis not present

## 2022-04-22 DIAGNOSIS — N2581 Secondary hyperparathyroidism of renal origin: Secondary | ICD-10-CM | POA: Diagnosis not present

## 2022-04-22 DIAGNOSIS — N186 End stage renal disease: Secondary | ICD-10-CM | POA: Diagnosis not present

## 2022-04-25 DIAGNOSIS — N186 End stage renal disease: Secondary | ICD-10-CM | POA: Diagnosis not present

## 2022-04-25 DIAGNOSIS — N2581 Secondary hyperparathyroidism of renal origin: Secondary | ICD-10-CM | POA: Diagnosis not present

## 2022-04-25 DIAGNOSIS — Z992 Dependence on renal dialysis: Secondary | ICD-10-CM | POA: Diagnosis not present

## 2022-04-27 DIAGNOSIS — N186 End stage renal disease: Secondary | ICD-10-CM | POA: Diagnosis not present

## 2022-04-27 DIAGNOSIS — Z992 Dependence on renal dialysis: Secondary | ICD-10-CM | POA: Diagnosis not present

## 2022-04-27 DIAGNOSIS — N2581 Secondary hyperparathyroidism of renal origin: Secondary | ICD-10-CM | POA: Diagnosis not present

## 2022-04-29 DIAGNOSIS — N2581 Secondary hyperparathyroidism of renal origin: Secondary | ICD-10-CM | POA: Diagnosis not present

## 2022-04-29 DIAGNOSIS — N186 End stage renal disease: Secondary | ICD-10-CM | POA: Diagnosis not present

## 2022-04-29 DIAGNOSIS — Z992 Dependence on renal dialysis: Secondary | ICD-10-CM | POA: Diagnosis not present

## 2022-05-02 DIAGNOSIS — Z992 Dependence on renal dialysis: Secondary | ICD-10-CM | POA: Diagnosis not present

## 2022-05-02 DIAGNOSIS — N2581 Secondary hyperparathyroidism of renal origin: Secondary | ICD-10-CM | POA: Diagnosis not present

## 2022-05-02 DIAGNOSIS — N186 End stage renal disease: Secondary | ICD-10-CM | POA: Diagnosis not present

## 2022-05-04 DIAGNOSIS — Z992 Dependence on renal dialysis: Secondary | ICD-10-CM | POA: Diagnosis not present

## 2022-05-04 DIAGNOSIS — N186 End stage renal disease: Secondary | ICD-10-CM | POA: Diagnosis not present

## 2022-05-04 DIAGNOSIS — N2581 Secondary hyperparathyroidism of renal origin: Secondary | ICD-10-CM | POA: Diagnosis not present

## 2022-05-06 DIAGNOSIS — Z992 Dependence on renal dialysis: Secondary | ICD-10-CM | POA: Diagnosis not present

## 2022-05-06 DIAGNOSIS — N2581 Secondary hyperparathyroidism of renal origin: Secondary | ICD-10-CM | POA: Diagnosis not present

## 2022-05-06 DIAGNOSIS — N186 End stage renal disease: Secondary | ICD-10-CM | POA: Diagnosis not present

## 2022-05-09 DIAGNOSIS — Z992 Dependence on renal dialysis: Secondary | ICD-10-CM | POA: Diagnosis not present

## 2022-05-09 DIAGNOSIS — N186 End stage renal disease: Secondary | ICD-10-CM | POA: Diagnosis not present

## 2022-05-09 DIAGNOSIS — N2581 Secondary hyperparathyroidism of renal origin: Secondary | ICD-10-CM | POA: Diagnosis not present

## 2022-05-11 DIAGNOSIS — N186 End stage renal disease: Secondary | ICD-10-CM | POA: Diagnosis not present

## 2022-05-11 DIAGNOSIS — Z992 Dependence on renal dialysis: Secondary | ICD-10-CM | POA: Diagnosis not present

## 2022-05-11 DIAGNOSIS — N2581 Secondary hyperparathyroidism of renal origin: Secondary | ICD-10-CM | POA: Diagnosis not present

## 2022-05-13 DIAGNOSIS — Z992 Dependence on renal dialysis: Secondary | ICD-10-CM | POA: Diagnosis not present

## 2022-05-13 DIAGNOSIS — N186 End stage renal disease: Secondary | ICD-10-CM | POA: Diagnosis not present

## 2022-05-13 DIAGNOSIS — N2581 Secondary hyperparathyroidism of renal origin: Secondary | ICD-10-CM | POA: Diagnosis not present

## 2022-05-16 DIAGNOSIS — Z992 Dependence on renal dialysis: Secondary | ICD-10-CM | POA: Diagnosis not present

## 2022-05-16 DIAGNOSIS — N186 End stage renal disease: Secondary | ICD-10-CM | POA: Diagnosis not present

## 2022-05-16 DIAGNOSIS — N2581 Secondary hyperparathyroidism of renal origin: Secondary | ICD-10-CM | POA: Diagnosis not present

## 2022-05-18 DIAGNOSIS — Z992 Dependence on renal dialysis: Secondary | ICD-10-CM | POA: Diagnosis not present

## 2022-05-18 DIAGNOSIS — N2581 Secondary hyperparathyroidism of renal origin: Secondary | ICD-10-CM | POA: Diagnosis not present

## 2022-05-18 DIAGNOSIS — N186 End stage renal disease: Secondary | ICD-10-CM | POA: Diagnosis not present

## 2022-05-20 DIAGNOSIS — Z992 Dependence on renal dialysis: Secondary | ICD-10-CM | POA: Diagnosis not present

## 2022-05-20 DIAGNOSIS — N186 End stage renal disease: Secondary | ICD-10-CM | POA: Diagnosis not present

## 2022-05-20 DIAGNOSIS — N2581 Secondary hyperparathyroidism of renal origin: Secondary | ICD-10-CM | POA: Diagnosis not present

## 2022-05-23 DIAGNOSIS — Z992 Dependence on renal dialysis: Secondary | ICD-10-CM | POA: Diagnosis not present

## 2022-05-23 DIAGNOSIS — N186 End stage renal disease: Secondary | ICD-10-CM | POA: Diagnosis not present

## 2022-05-23 DIAGNOSIS — N2581 Secondary hyperparathyroidism of renal origin: Secondary | ICD-10-CM | POA: Diagnosis not present

## 2022-05-25 DIAGNOSIS — N2581 Secondary hyperparathyroidism of renal origin: Secondary | ICD-10-CM | POA: Diagnosis not present

## 2022-05-25 DIAGNOSIS — Z992 Dependence on renal dialysis: Secondary | ICD-10-CM | POA: Diagnosis not present

## 2022-05-25 DIAGNOSIS — N186 End stage renal disease: Secondary | ICD-10-CM | POA: Diagnosis not present

## 2022-05-27 DIAGNOSIS — N186 End stage renal disease: Secondary | ICD-10-CM | POA: Diagnosis not present

## 2022-05-27 DIAGNOSIS — Z992 Dependence on renal dialysis: Secondary | ICD-10-CM | POA: Diagnosis not present

## 2022-05-27 DIAGNOSIS — N2581 Secondary hyperparathyroidism of renal origin: Secondary | ICD-10-CM | POA: Diagnosis not present

## 2022-05-30 DIAGNOSIS — N2581 Secondary hyperparathyroidism of renal origin: Secondary | ICD-10-CM | POA: Diagnosis not present

## 2022-05-30 DIAGNOSIS — Z992 Dependence on renal dialysis: Secondary | ICD-10-CM | POA: Diagnosis not present

## 2022-05-30 DIAGNOSIS — N186 End stage renal disease: Secondary | ICD-10-CM | POA: Diagnosis not present

## 2022-06-01 DIAGNOSIS — Z992 Dependence on renal dialysis: Secondary | ICD-10-CM | POA: Diagnosis not present

## 2022-06-01 DIAGNOSIS — N2581 Secondary hyperparathyroidism of renal origin: Secondary | ICD-10-CM | POA: Diagnosis not present

## 2022-06-01 DIAGNOSIS — N186 End stage renal disease: Secondary | ICD-10-CM | POA: Diagnosis not present

## 2022-06-03 DIAGNOSIS — N186 End stage renal disease: Secondary | ICD-10-CM | POA: Diagnosis not present

## 2022-06-03 DIAGNOSIS — Z992 Dependence on renal dialysis: Secondary | ICD-10-CM | POA: Diagnosis not present

## 2022-06-03 DIAGNOSIS — N2581 Secondary hyperparathyroidism of renal origin: Secondary | ICD-10-CM | POA: Diagnosis not present

## 2022-06-06 DIAGNOSIS — N2581 Secondary hyperparathyroidism of renal origin: Secondary | ICD-10-CM | POA: Diagnosis not present

## 2022-06-06 DIAGNOSIS — Z992 Dependence on renal dialysis: Secondary | ICD-10-CM | POA: Diagnosis not present

## 2022-06-06 DIAGNOSIS — N186 End stage renal disease: Secondary | ICD-10-CM | POA: Diagnosis not present

## 2022-06-08 DIAGNOSIS — N186 End stage renal disease: Secondary | ICD-10-CM | POA: Diagnosis not present

## 2022-06-08 DIAGNOSIS — Z992 Dependence on renal dialysis: Secondary | ICD-10-CM | POA: Diagnosis not present

## 2022-06-08 DIAGNOSIS — N2581 Secondary hyperparathyroidism of renal origin: Secondary | ICD-10-CM | POA: Diagnosis not present

## 2022-06-10 DIAGNOSIS — Z992 Dependence on renal dialysis: Secondary | ICD-10-CM | POA: Diagnosis not present

## 2022-06-10 DIAGNOSIS — N186 End stage renal disease: Secondary | ICD-10-CM | POA: Diagnosis not present

## 2022-06-10 DIAGNOSIS — N2581 Secondary hyperparathyroidism of renal origin: Secondary | ICD-10-CM | POA: Diagnosis not present

## 2022-06-13 DIAGNOSIS — N2581 Secondary hyperparathyroidism of renal origin: Secondary | ICD-10-CM | POA: Diagnosis not present

## 2022-06-13 DIAGNOSIS — N186 End stage renal disease: Secondary | ICD-10-CM | POA: Diagnosis not present

## 2022-06-13 DIAGNOSIS — Z992 Dependence on renal dialysis: Secondary | ICD-10-CM | POA: Diagnosis not present

## 2022-06-15 DIAGNOSIS — Z992 Dependence on renal dialysis: Secondary | ICD-10-CM | POA: Diagnosis not present

## 2022-06-15 DIAGNOSIS — N186 End stage renal disease: Secondary | ICD-10-CM | POA: Diagnosis not present

## 2022-06-15 DIAGNOSIS — N2581 Secondary hyperparathyroidism of renal origin: Secondary | ICD-10-CM | POA: Diagnosis not present

## 2022-06-17 DIAGNOSIS — Z992 Dependence on renal dialysis: Secondary | ICD-10-CM | POA: Diagnosis not present

## 2022-06-17 DIAGNOSIS — N2581 Secondary hyperparathyroidism of renal origin: Secondary | ICD-10-CM | POA: Diagnosis not present

## 2022-06-17 DIAGNOSIS — N186 End stage renal disease: Secondary | ICD-10-CM | POA: Diagnosis not present

## 2022-06-18 IMAGING — MG MM DIGITAL DIAGNOSTIC UNILAT*R* W/ TOMO W/ CAD
8 of 14 series · 8 of 40 positions shown · non-contrast
Comparison: Previous exam(s).

CLINICAL DATA: Possible mass in the retroareolar right breast and
possible mass in the mid outer right breast on a recent screening
mammogram.

EXAM:
DIGITAL DIAGNOSTIC UNILATERAL RIGHT MAMMOGRAM WITH TOMOSYNTHESIS AND
CAD; ULTRASOUND RIGHT BREAST LIMITED
TECHNIQUE: Right digital diagnostic mammography and breast tomosynthesis was
performed. The images were evaluated with computer-aided detection.;
Targeted ultrasound examination of the right breast was performed

[R MLO synth-2D (1 of 3)]
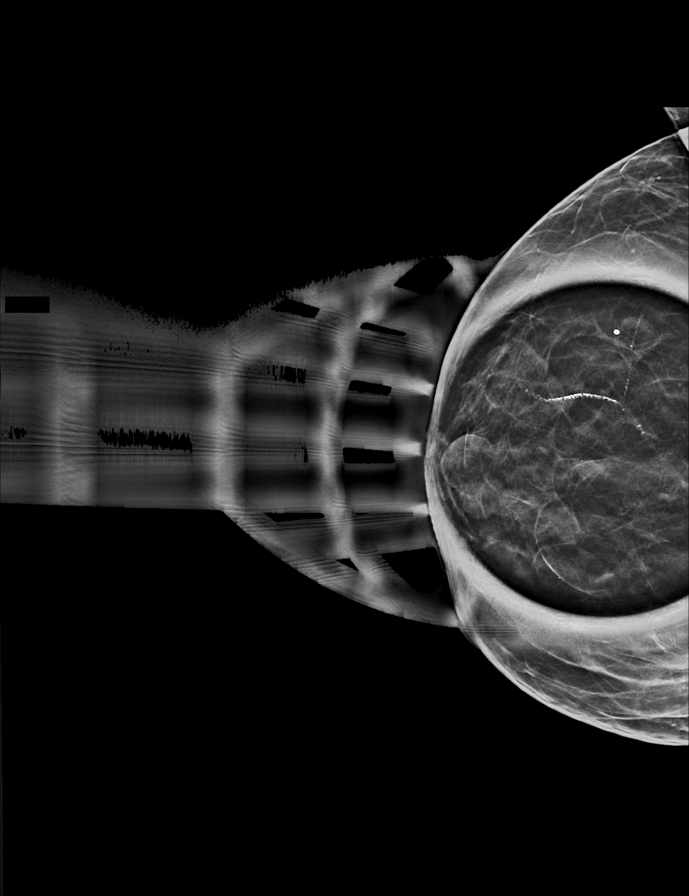

[R MLO synth-2D (2 of 3)]
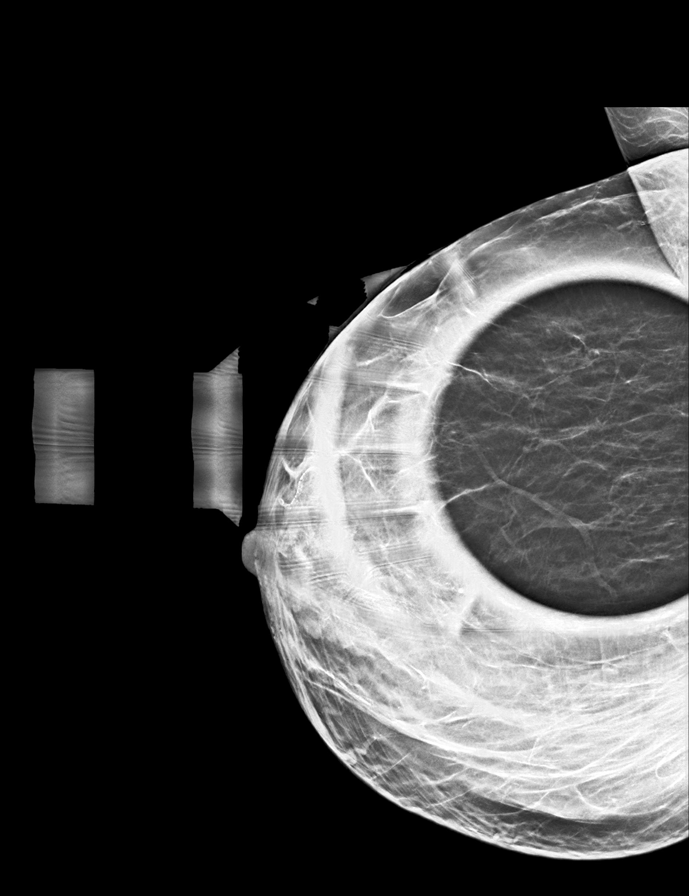

[R CC synth-2D (1 of 4)]
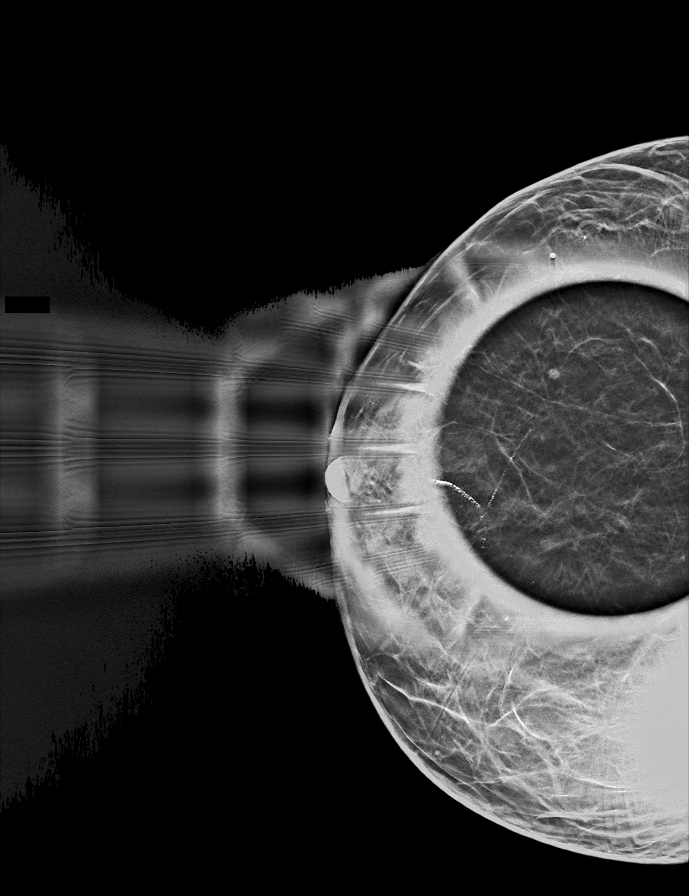

[R CC synth-2D (2 of 4)]
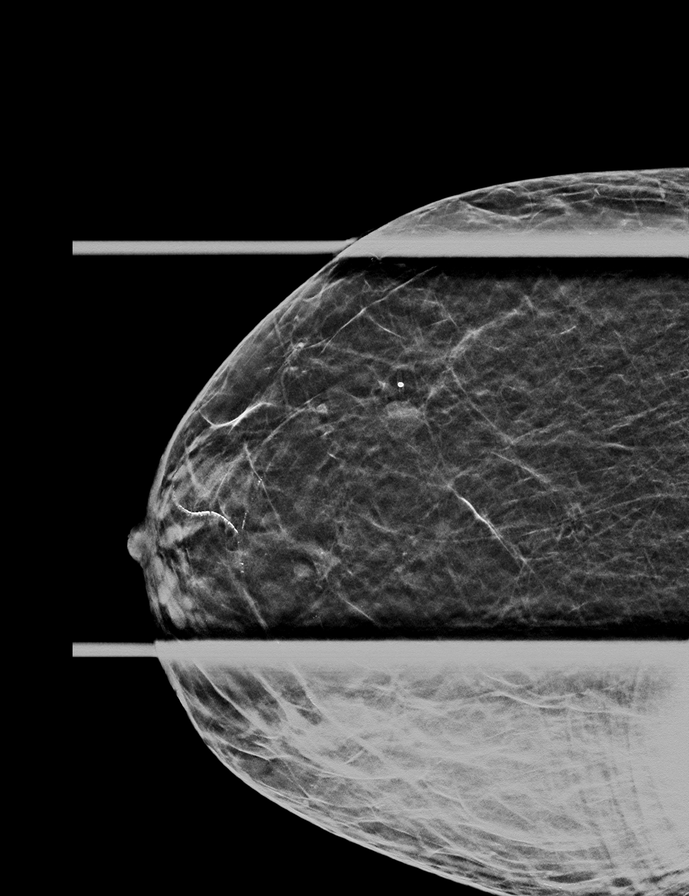

[R CC synth-2D (3 of 4)]
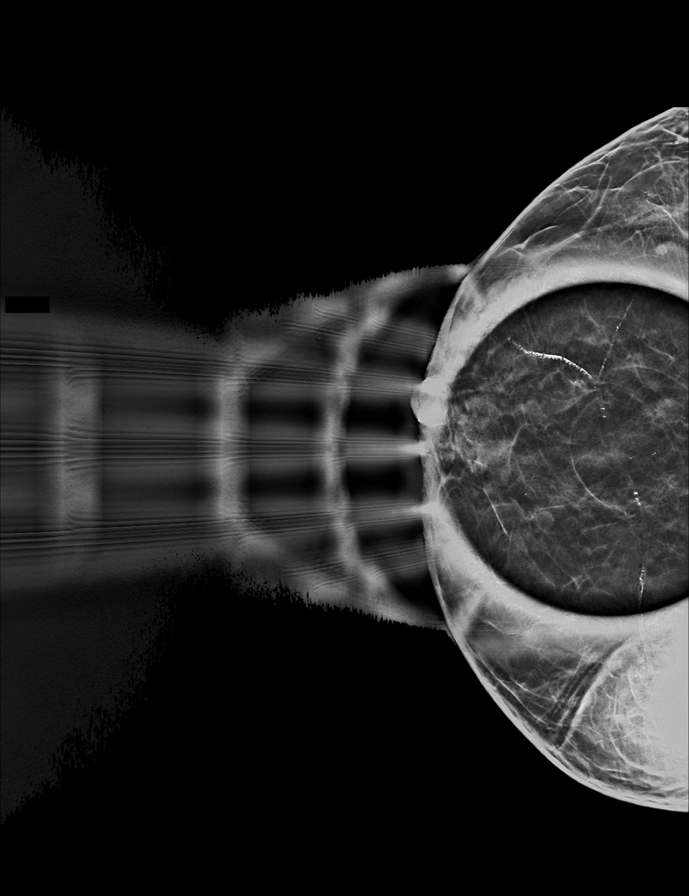

[R MLO synth-2D (3 of 3)]
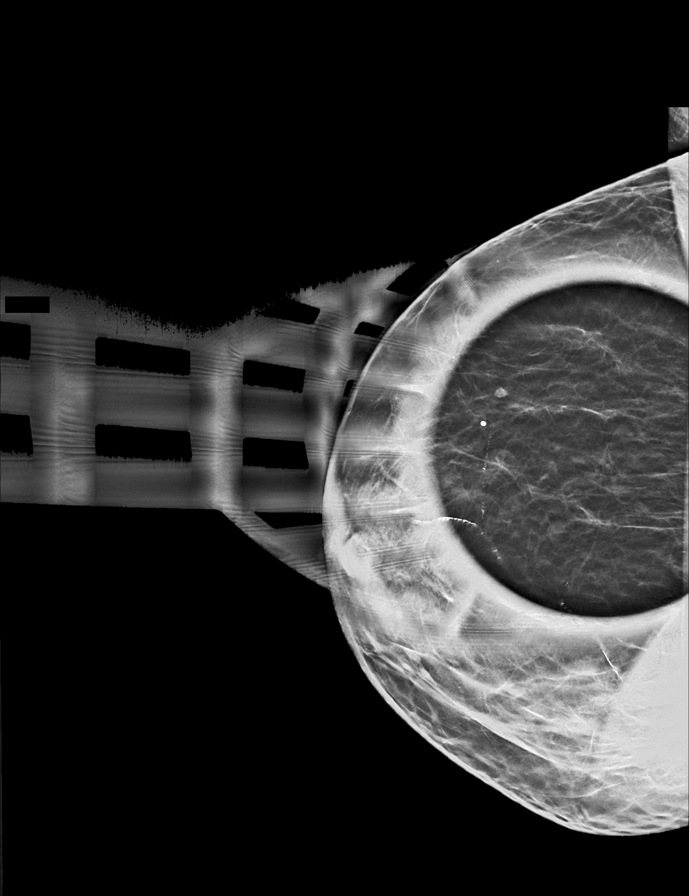

[R CC synth-2D (4 of 4)]
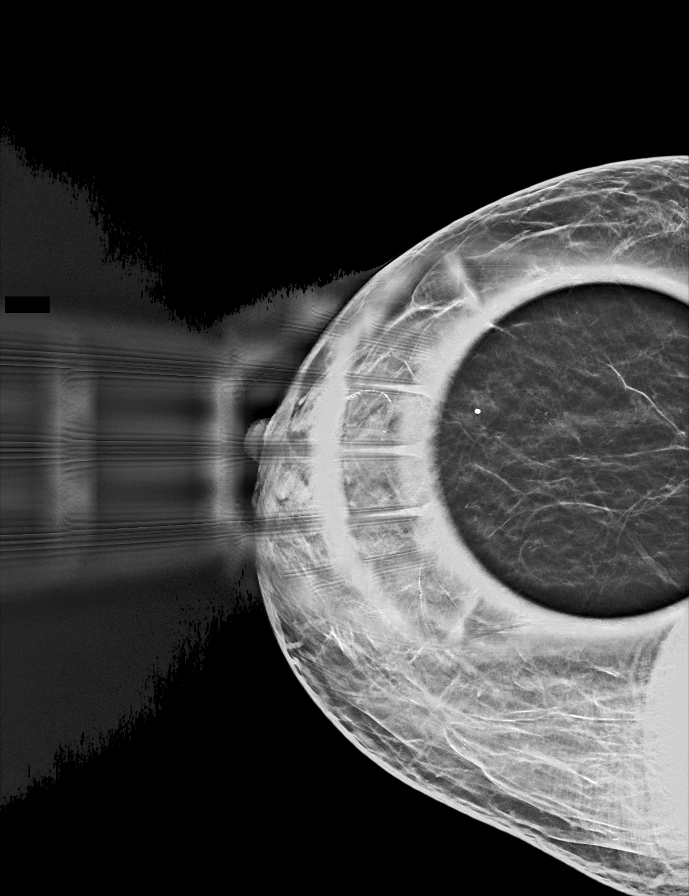

[R MLO tomo · tomo slice 15/28.0]
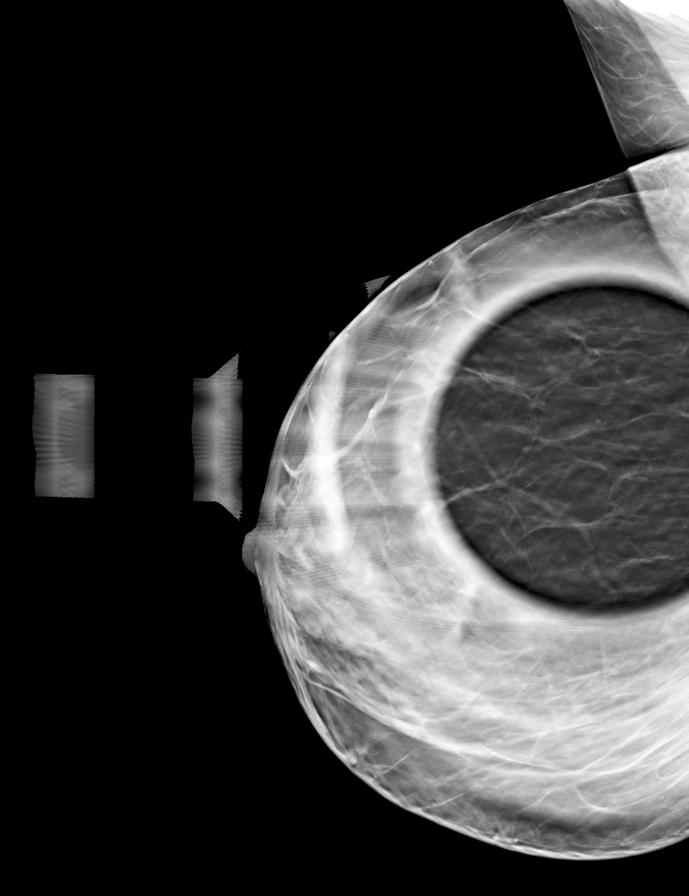

[8 of 40 positions shown; findings below may reference images not displayed]

ACR Breast Density Category b: There are scattered areas of
fibroglandular density.
FINDINGS: 3D tomographic and 2D generated spot compression images of the right
breast were obtained in the areas of mammographic concern. These
confirm a small, oval, partially circumscribed and partially
indistinct or obscured mass in the outer mid right breast. There is
also a mass or dilated duct in the slightly medial retroareolar
right breast.

On physical exam, no mass is palpable in the retroareolar right
breast, outer right breast or right axilla.

Targeted ultrasound is performed, showing mildly to moderately
dilated retroareolar ducts on the right with no intraductal masses
seen.

A 1.0 cm simple appearing oval cyst is demonstrated in the 9 o'clock
position of the right breast, 1 cm from the nipple.

A smaller cyst is demonstrated in the 10 to 11 o'clock position of
the right breast, 2 cm from the nipple.

An oval hypoechoic mass is demonstrated in the 10 o'clock position
of the right breast, 2 cm from the nipple. This has some
circumscribed and some indistinct margins. This has a small amount
of internal blood flow with power Doppler and measures 6 x 5 x 3 mm.

A 1.7 x 1.3 x 0.8 cm anechoic cystic area is demonstrated in the
right axilla with a mildly thickened hypoechoic wall measuring up to
1.2 mm in thickness with no wall nodularity. No internal blood flow
was seen with power Doppler.

There is an adjacent 8 x 7 x 4 mm oval, hypoechoic right axillary
mass more superiorly with some circumscribed and some indistinct
margins. No internal blood flow was seen with power Doppler
IMPRESSION: 1. 6 mm indeterminate mass in the 10 o'clock position of the right
breast, 2 cm from the nipple.
2. 8 mm indeterminate mass in the right axilla.
3. Additional 1.7 cm cystic area in the right axilla with a mildly
thickened surrounding hypoechoic wall. This could potentially
represent a cystic area within accessory breast tissue in the axilla
or a necrotic lymph node.
4. Benign retroareolar right breast duct ectasia and 2 small benign
right breast cysts.

RECOMMENDATION:
1. Ultrasound-guided core needle biopsy of the 6 mm mass in the 10
o'clock position of the right breast.
2. Ultrasound-guided core needle biopsy of the 8 mm mass in the
right axilla. This has been discussed with the patient and the
biopsies have been scheduled at 9 a.m. on 11/16/2021.

I have discussed the findings and recommendations with the patient.
If applicable, a reminder letter will be sent to the patient
regarding the next appointment.

BI-RADS CATEGORY  4: Suspicious.

## 2022-06-20 DIAGNOSIS — Z992 Dependence on renal dialysis: Secondary | ICD-10-CM | POA: Diagnosis not present

## 2022-06-20 DIAGNOSIS — N186 End stage renal disease: Secondary | ICD-10-CM | POA: Diagnosis not present

## 2022-06-20 DIAGNOSIS — N2581 Secondary hyperparathyroidism of renal origin: Secondary | ICD-10-CM | POA: Diagnosis not present

## 2022-06-22 DIAGNOSIS — N2581 Secondary hyperparathyroidism of renal origin: Secondary | ICD-10-CM | POA: Diagnosis not present

## 2022-06-22 DIAGNOSIS — N186 End stage renal disease: Secondary | ICD-10-CM | POA: Diagnosis not present

## 2022-06-22 DIAGNOSIS — Z992 Dependence on renal dialysis: Secondary | ICD-10-CM | POA: Diagnosis not present

## 2022-06-24 DIAGNOSIS — N2581 Secondary hyperparathyroidism of renal origin: Secondary | ICD-10-CM | POA: Diagnosis not present

## 2022-06-24 DIAGNOSIS — Z992 Dependence on renal dialysis: Secondary | ICD-10-CM | POA: Diagnosis not present

## 2022-06-24 DIAGNOSIS — N186 End stage renal disease: Secondary | ICD-10-CM | POA: Diagnosis not present

## 2022-06-27 DIAGNOSIS — Z992 Dependence on renal dialysis: Secondary | ICD-10-CM | POA: Diagnosis not present

## 2022-06-27 DIAGNOSIS — N2581 Secondary hyperparathyroidism of renal origin: Secondary | ICD-10-CM | POA: Diagnosis not present

## 2022-06-27 DIAGNOSIS — N186 End stage renal disease: Secondary | ICD-10-CM | POA: Diagnosis not present

## 2022-06-29 DIAGNOSIS — N2581 Secondary hyperparathyroidism of renal origin: Secondary | ICD-10-CM | POA: Diagnosis not present

## 2022-06-29 DIAGNOSIS — Z992 Dependence on renal dialysis: Secondary | ICD-10-CM | POA: Diagnosis not present

## 2022-06-29 DIAGNOSIS — N186 End stage renal disease: Secondary | ICD-10-CM | POA: Diagnosis not present

## 2022-07-01 DIAGNOSIS — Z992 Dependence on renal dialysis: Secondary | ICD-10-CM | POA: Diagnosis not present

## 2022-07-01 DIAGNOSIS — N186 End stage renal disease: Secondary | ICD-10-CM | POA: Diagnosis not present

## 2022-07-01 DIAGNOSIS — N2581 Secondary hyperparathyroidism of renal origin: Secondary | ICD-10-CM | POA: Diagnosis not present

## 2022-07-04 DIAGNOSIS — Z992 Dependence on renal dialysis: Secondary | ICD-10-CM | POA: Diagnosis not present

## 2022-07-04 DIAGNOSIS — N186 End stage renal disease: Secondary | ICD-10-CM | POA: Diagnosis not present

## 2022-07-04 DIAGNOSIS — N2581 Secondary hyperparathyroidism of renal origin: Secondary | ICD-10-CM | POA: Diagnosis not present

## 2022-07-06 DIAGNOSIS — N2581 Secondary hyperparathyroidism of renal origin: Secondary | ICD-10-CM | POA: Diagnosis not present

## 2022-07-06 DIAGNOSIS — Z992 Dependence on renal dialysis: Secondary | ICD-10-CM | POA: Diagnosis not present

## 2022-07-06 DIAGNOSIS — N186 End stage renal disease: Secondary | ICD-10-CM | POA: Diagnosis not present

## 2022-07-08 DIAGNOSIS — N186 End stage renal disease: Secondary | ICD-10-CM | POA: Diagnosis not present

## 2022-07-08 DIAGNOSIS — Z992 Dependence on renal dialysis: Secondary | ICD-10-CM | POA: Diagnosis not present

## 2022-07-08 DIAGNOSIS — N2581 Secondary hyperparathyroidism of renal origin: Secondary | ICD-10-CM | POA: Diagnosis not present

## 2022-07-11 DIAGNOSIS — N186 End stage renal disease: Secondary | ICD-10-CM | POA: Diagnosis not present

## 2022-07-11 DIAGNOSIS — N2581 Secondary hyperparathyroidism of renal origin: Secondary | ICD-10-CM | POA: Diagnosis not present

## 2022-07-11 DIAGNOSIS — Z992 Dependence on renal dialysis: Secondary | ICD-10-CM | POA: Diagnosis not present

## 2022-07-13 DIAGNOSIS — N186 End stage renal disease: Secondary | ICD-10-CM | POA: Diagnosis not present

## 2022-07-13 DIAGNOSIS — N2581 Secondary hyperparathyroidism of renal origin: Secondary | ICD-10-CM | POA: Diagnosis not present

## 2022-07-13 DIAGNOSIS — Z992 Dependence on renal dialysis: Secondary | ICD-10-CM | POA: Diagnosis not present

## 2022-07-15 DIAGNOSIS — N186 End stage renal disease: Secondary | ICD-10-CM | POA: Diagnosis not present

## 2022-07-15 DIAGNOSIS — Z992 Dependence on renal dialysis: Secondary | ICD-10-CM | POA: Diagnosis not present

## 2022-07-15 DIAGNOSIS — N2581 Secondary hyperparathyroidism of renal origin: Secondary | ICD-10-CM | POA: Diagnosis not present

## 2022-07-18 DIAGNOSIS — N2581 Secondary hyperparathyroidism of renal origin: Secondary | ICD-10-CM | POA: Diagnosis not present

## 2022-07-18 DIAGNOSIS — N186 End stage renal disease: Secondary | ICD-10-CM | POA: Diagnosis not present

## 2022-07-18 DIAGNOSIS — Z992 Dependence on renal dialysis: Secondary | ICD-10-CM | POA: Diagnosis not present

## 2022-07-20 DIAGNOSIS — Z992 Dependence on renal dialysis: Secondary | ICD-10-CM | POA: Diagnosis not present

## 2022-07-20 DIAGNOSIS — N186 End stage renal disease: Secondary | ICD-10-CM | POA: Diagnosis not present

## 2022-07-20 DIAGNOSIS — N2581 Secondary hyperparathyroidism of renal origin: Secondary | ICD-10-CM | POA: Diagnosis not present

## 2022-07-21 DIAGNOSIS — N186 End stage renal disease: Secondary | ICD-10-CM | POA: Diagnosis not present

## 2022-07-21 DIAGNOSIS — Z992 Dependence on renal dialysis: Secondary | ICD-10-CM | POA: Diagnosis not present

## 2022-07-22 DIAGNOSIS — Z992 Dependence on renal dialysis: Secondary | ICD-10-CM | POA: Diagnosis not present

## 2022-07-22 DIAGNOSIS — N186 End stage renal disease: Secondary | ICD-10-CM | POA: Diagnosis not present

## 2022-07-22 DIAGNOSIS — N2581 Secondary hyperparathyroidism of renal origin: Secondary | ICD-10-CM | POA: Diagnosis not present

## 2022-07-25 DIAGNOSIS — Z992 Dependence on renal dialysis: Secondary | ICD-10-CM | POA: Diagnosis not present

## 2022-07-25 DIAGNOSIS — N186 End stage renal disease: Secondary | ICD-10-CM | POA: Diagnosis not present

## 2022-07-25 DIAGNOSIS — N2581 Secondary hyperparathyroidism of renal origin: Secondary | ICD-10-CM | POA: Diagnosis not present

## 2022-07-27 DIAGNOSIS — N2581 Secondary hyperparathyroidism of renal origin: Secondary | ICD-10-CM | POA: Diagnosis not present

## 2022-07-27 DIAGNOSIS — N186 End stage renal disease: Secondary | ICD-10-CM | POA: Diagnosis not present

## 2022-07-27 DIAGNOSIS — Z992 Dependence on renal dialysis: Secondary | ICD-10-CM | POA: Diagnosis not present

## 2022-07-29 DIAGNOSIS — Z992 Dependence on renal dialysis: Secondary | ICD-10-CM | POA: Diagnosis not present

## 2022-07-29 DIAGNOSIS — N186 End stage renal disease: Secondary | ICD-10-CM | POA: Diagnosis not present

## 2022-07-29 DIAGNOSIS — N2581 Secondary hyperparathyroidism of renal origin: Secondary | ICD-10-CM | POA: Diagnosis not present

## 2022-08-01 DIAGNOSIS — N2581 Secondary hyperparathyroidism of renal origin: Secondary | ICD-10-CM | POA: Diagnosis not present

## 2022-08-01 DIAGNOSIS — N186 End stage renal disease: Secondary | ICD-10-CM | POA: Diagnosis not present

## 2022-08-01 DIAGNOSIS — Z992 Dependence on renal dialysis: Secondary | ICD-10-CM | POA: Diagnosis not present

## 2022-08-03 DIAGNOSIS — Z992 Dependence on renal dialysis: Secondary | ICD-10-CM | POA: Diagnosis not present

## 2022-08-03 DIAGNOSIS — N186 End stage renal disease: Secondary | ICD-10-CM | POA: Diagnosis not present

## 2022-08-03 DIAGNOSIS — N2581 Secondary hyperparathyroidism of renal origin: Secondary | ICD-10-CM | POA: Diagnosis not present

## 2022-08-05 DIAGNOSIS — N186 End stage renal disease: Secondary | ICD-10-CM | POA: Diagnosis not present

## 2022-08-05 DIAGNOSIS — Z992 Dependence on renal dialysis: Secondary | ICD-10-CM | POA: Diagnosis not present

## 2022-08-05 DIAGNOSIS — N2581 Secondary hyperparathyroidism of renal origin: Secondary | ICD-10-CM | POA: Diagnosis not present

## 2022-08-08 DIAGNOSIS — N2581 Secondary hyperparathyroidism of renal origin: Secondary | ICD-10-CM | POA: Diagnosis not present

## 2022-08-08 DIAGNOSIS — Z992 Dependence on renal dialysis: Secondary | ICD-10-CM | POA: Diagnosis not present

## 2022-08-08 DIAGNOSIS — N186 End stage renal disease: Secondary | ICD-10-CM | POA: Diagnosis not present

## 2022-08-10 DIAGNOSIS — N2581 Secondary hyperparathyroidism of renal origin: Secondary | ICD-10-CM | POA: Diagnosis not present

## 2022-08-10 DIAGNOSIS — Z992 Dependence on renal dialysis: Secondary | ICD-10-CM | POA: Diagnosis not present

## 2022-08-10 DIAGNOSIS — N186 End stage renal disease: Secondary | ICD-10-CM | POA: Diagnosis not present

## 2022-08-12 DIAGNOSIS — Z992 Dependence on renal dialysis: Secondary | ICD-10-CM | POA: Diagnosis not present

## 2022-08-12 DIAGNOSIS — N186 End stage renal disease: Secondary | ICD-10-CM | POA: Diagnosis not present

## 2022-08-12 DIAGNOSIS — N2581 Secondary hyperparathyroidism of renal origin: Secondary | ICD-10-CM | POA: Diagnosis not present

## 2022-08-15 DIAGNOSIS — N186 End stage renal disease: Secondary | ICD-10-CM | POA: Diagnosis not present

## 2022-08-15 DIAGNOSIS — Z992 Dependence on renal dialysis: Secondary | ICD-10-CM | POA: Diagnosis not present

## 2022-08-15 DIAGNOSIS — N2581 Secondary hyperparathyroidism of renal origin: Secondary | ICD-10-CM | POA: Diagnosis not present

## 2022-08-17 DIAGNOSIS — N186 End stage renal disease: Secondary | ICD-10-CM | POA: Diagnosis not present

## 2022-08-17 DIAGNOSIS — Z992 Dependence on renal dialysis: Secondary | ICD-10-CM | POA: Diagnosis not present

## 2022-08-17 DIAGNOSIS — N2581 Secondary hyperparathyroidism of renal origin: Secondary | ICD-10-CM | POA: Diagnosis not present

## 2022-08-19 DIAGNOSIS — N186 End stage renal disease: Secondary | ICD-10-CM | POA: Diagnosis not present

## 2022-08-19 DIAGNOSIS — Z992 Dependence on renal dialysis: Secondary | ICD-10-CM | POA: Diagnosis not present

## 2022-08-19 DIAGNOSIS — N2581 Secondary hyperparathyroidism of renal origin: Secondary | ICD-10-CM | POA: Diagnosis not present

## 2022-08-20 DIAGNOSIS — N186 End stage renal disease: Secondary | ICD-10-CM | POA: Diagnosis not present

## 2022-08-20 DIAGNOSIS — Z992 Dependence on renal dialysis: Secondary | ICD-10-CM | POA: Diagnosis not present

## 2022-08-22 DIAGNOSIS — N186 End stage renal disease: Secondary | ICD-10-CM | POA: Diagnosis not present

## 2022-08-22 DIAGNOSIS — Z992 Dependence on renal dialysis: Secondary | ICD-10-CM | POA: Diagnosis not present

## 2022-08-22 DIAGNOSIS — N2581 Secondary hyperparathyroidism of renal origin: Secondary | ICD-10-CM | POA: Diagnosis not present

## 2022-08-24 DIAGNOSIS — N186 End stage renal disease: Secondary | ICD-10-CM | POA: Diagnosis not present

## 2022-08-24 DIAGNOSIS — Z992 Dependence on renal dialysis: Secondary | ICD-10-CM | POA: Diagnosis not present

## 2022-08-24 DIAGNOSIS — N2581 Secondary hyperparathyroidism of renal origin: Secondary | ICD-10-CM | POA: Diagnosis not present

## 2022-08-26 DIAGNOSIS — N186 End stage renal disease: Secondary | ICD-10-CM | POA: Diagnosis not present

## 2022-08-26 DIAGNOSIS — Z992 Dependence on renal dialysis: Secondary | ICD-10-CM | POA: Diagnosis not present

## 2022-08-26 DIAGNOSIS — N2581 Secondary hyperparathyroidism of renal origin: Secondary | ICD-10-CM | POA: Diagnosis not present

## 2022-08-29 DIAGNOSIS — Z992 Dependence on renal dialysis: Secondary | ICD-10-CM | POA: Diagnosis not present

## 2022-08-29 DIAGNOSIS — N2581 Secondary hyperparathyroidism of renal origin: Secondary | ICD-10-CM | POA: Diagnosis not present

## 2022-08-29 DIAGNOSIS — N186 End stage renal disease: Secondary | ICD-10-CM | POA: Diagnosis not present

## 2022-08-31 DIAGNOSIS — Z992 Dependence on renal dialysis: Secondary | ICD-10-CM | POA: Diagnosis not present

## 2022-08-31 DIAGNOSIS — N2581 Secondary hyperparathyroidism of renal origin: Secondary | ICD-10-CM | POA: Diagnosis not present

## 2022-08-31 DIAGNOSIS — N186 End stage renal disease: Secondary | ICD-10-CM | POA: Diagnosis not present

## 2022-09-02 DIAGNOSIS — Z992 Dependence on renal dialysis: Secondary | ICD-10-CM | POA: Diagnosis not present

## 2022-09-02 DIAGNOSIS — N186 End stage renal disease: Secondary | ICD-10-CM | POA: Diagnosis not present

## 2022-09-02 DIAGNOSIS — N2581 Secondary hyperparathyroidism of renal origin: Secondary | ICD-10-CM | POA: Diagnosis not present

## 2022-09-05 DIAGNOSIS — N2581 Secondary hyperparathyroidism of renal origin: Secondary | ICD-10-CM | POA: Diagnosis not present

## 2022-09-05 DIAGNOSIS — N186 End stage renal disease: Secondary | ICD-10-CM | POA: Diagnosis not present

## 2022-09-05 DIAGNOSIS — Z992 Dependence on renal dialysis: Secondary | ICD-10-CM | POA: Diagnosis not present

## 2022-09-07 DIAGNOSIS — N186 End stage renal disease: Secondary | ICD-10-CM | POA: Diagnosis not present

## 2022-09-07 DIAGNOSIS — N2581 Secondary hyperparathyroidism of renal origin: Secondary | ICD-10-CM | POA: Diagnosis not present

## 2022-09-07 DIAGNOSIS — Z992 Dependence on renal dialysis: Secondary | ICD-10-CM | POA: Diagnosis not present

## 2022-09-09 DIAGNOSIS — N186 End stage renal disease: Secondary | ICD-10-CM | POA: Diagnosis not present

## 2022-09-09 DIAGNOSIS — Z992 Dependence on renal dialysis: Secondary | ICD-10-CM | POA: Diagnosis not present

## 2022-09-09 DIAGNOSIS — N2581 Secondary hyperparathyroidism of renal origin: Secondary | ICD-10-CM | POA: Diagnosis not present

## 2022-09-12 DIAGNOSIS — Z992 Dependence on renal dialysis: Secondary | ICD-10-CM | POA: Diagnosis not present

## 2022-09-12 DIAGNOSIS — N2581 Secondary hyperparathyroidism of renal origin: Secondary | ICD-10-CM | POA: Diagnosis not present

## 2022-09-12 DIAGNOSIS — N186 End stage renal disease: Secondary | ICD-10-CM | POA: Diagnosis not present

## 2022-09-14 DIAGNOSIS — N186 End stage renal disease: Secondary | ICD-10-CM | POA: Diagnosis not present

## 2022-09-14 DIAGNOSIS — N2581 Secondary hyperparathyroidism of renal origin: Secondary | ICD-10-CM | POA: Diagnosis not present

## 2022-09-14 DIAGNOSIS — Z992 Dependence on renal dialysis: Secondary | ICD-10-CM | POA: Diagnosis not present

## 2022-09-16 DIAGNOSIS — N186 End stage renal disease: Secondary | ICD-10-CM | POA: Diagnosis not present

## 2022-09-16 DIAGNOSIS — N2581 Secondary hyperparathyroidism of renal origin: Secondary | ICD-10-CM | POA: Diagnosis not present

## 2022-09-16 DIAGNOSIS — Z992 Dependence on renal dialysis: Secondary | ICD-10-CM | POA: Diagnosis not present

## 2022-09-19 DIAGNOSIS — Z992 Dependence on renal dialysis: Secondary | ICD-10-CM | POA: Diagnosis not present

## 2022-09-19 DIAGNOSIS — N186 End stage renal disease: Secondary | ICD-10-CM | POA: Diagnosis not present

## 2022-09-19 DIAGNOSIS — N2581 Secondary hyperparathyroidism of renal origin: Secondary | ICD-10-CM | POA: Diagnosis not present

## 2022-09-20 DIAGNOSIS — N186 End stage renal disease: Secondary | ICD-10-CM | POA: Diagnosis not present

## 2022-09-20 DIAGNOSIS — Z992 Dependence on renal dialysis: Secondary | ICD-10-CM | POA: Diagnosis not present

## 2022-09-21 DIAGNOSIS — N2581 Secondary hyperparathyroidism of renal origin: Secondary | ICD-10-CM | POA: Diagnosis not present

## 2022-09-21 DIAGNOSIS — Z992 Dependence on renal dialysis: Secondary | ICD-10-CM | POA: Diagnosis not present

## 2022-09-21 DIAGNOSIS — N186 End stage renal disease: Secondary | ICD-10-CM | POA: Diagnosis not present

## 2022-09-23 DIAGNOSIS — N186 End stage renal disease: Secondary | ICD-10-CM | POA: Diagnosis not present

## 2022-09-23 DIAGNOSIS — N2581 Secondary hyperparathyroidism of renal origin: Secondary | ICD-10-CM | POA: Diagnosis not present

## 2022-09-23 DIAGNOSIS — Z992 Dependence on renal dialysis: Secondary | ICD-10-CM | POA: Diagnosis not present

## 2022-09-26 DIAGNOSIS — N2581 Secondary hyperparathyroidism of renal origin: Secondary | ICD-10-CM | POA: Diagnosis not present

## 2022-09-26 DIAGNOSIS — N186 End stage renal disease: Secondary | ICD-10-CM | POA: Diagnosis not present

## 2022-09-26 DIAGNOSIS — Z992 Dependence on renal dialysis: Secondary | ICD-10-CM | POA: Diagnosis not present

## 2022-09-28 DIAGNOSIS — Z992 Dependence on renal dialysis: Secondary | ICD-10-CM | POA: Diagnosis not present

## 2022-09-28 DIAGNOSIS — N2581 Secondary hyperparathyroidism of renal origin: Secondary | ICD-10-CM | POA: Diagnosis not present

## 2022-09-28 DIAGNOSIS — N186 End stage renal disease: Secondary | ICD-10-CM | POA: Diagnosis not present

## 2022-09-30 DIAGNOSIS — N2581 Secondary hyperparathyroidism of renal origin: Secondary | ICD-10-CM | POA: Diagnosis not present

## 2022-09-30 DIAGNOSIS — N186 End stage renal disease: Secondary | ICD-10-CM | POA: Diagnosis not present

## 2022-09-30 DIAGNOSIS — Z992 Dependence on renal dialysis: Secondary | ICD-10-CM | POA: Diagnosis not present

## 2022-10-03 DIAGNOSIS — N2581 Secondary hyperparathyroidism of renal origin: Secondary | ICD-10-CM | POA: Diagnosis not present

## 2022-10-03 DIAGNOSIS — Z992 Dependence on renal dialysis: Secondary | ICD-10-CM | POA: Diagnosis not present

## 2022-10-03 DIAGNOSIS — N186 End stage renal disease: Secondary | ICD-10-CM | POA: Diagnosis not present

## 2022-10-05 DIAGNOSIS — N2581 Secondary hyperparathyroidism of renal origin: Secondary | ICD-10-CM | POA: Diagnosis not present

## 2022-10-05 DIAGNOSIS — N186 End stage renal disease: Secondary | ICD-10-CM | POA: Diagnosis not present

## 2022-10-05 DIAGNOSIS — Z992 Dependence on renal dialysis: Secondary | ICD-10-CM | POA: Diagnosis not present

## 2022-10-07 DIAGNOSIS — N186 End stage renal disease: Secondary | ICD-10-CM | POA: Diagnosis not present

## 2022-10-07 DIAGNOSIS — Z992 Dependence on renal dialysis: Secondary | ICD-10-CM | POA: Diagnosis not present

## 2022-10-07 DIAGNOSIS — N2581 Secondary hyperparathyroidism of renal origin: Secondary | ICD-10-CM | POA: Diagnosis not present

## 2022-10-10 DIAGNOSIS — Z992 Dependence on renal dialysis: Secondary | ICD-10-CM | POA: Diagnosis not present

## 2022-10-10 DIAGNOSIS — N186 End stage renal disease: Secondary | ICD-10-CM | POA: Diagnosis not present

## 2022-10-10 DIAGNOSIS — N2581 Secondary hyperparathyroidism of renal origin: Secondary | ICD-10-CM | POA: Diagnosis not present

## 2022-10-12 DIAGNOSIS — N2581 Secondary hyperparathyroidism of renal origin: Secondary | ICD-10-CM | POA: Diagnosis not present

## 2022-10-12 DIAGNOSIS — N186 End stage renal disease: Secondary | ICD-10-CM | POA: Diagnosis not present

## 2022-10-12 DIAGNOSIS — Z992 Dependence on renal dialysis: Secondary | ICD-10-CM | POA: Diagnosis not present

## 2022-10-14 DIAGNOSIS — Z992 Dependence on renal dialysis: Secondary | ICD-10-CM | POA: Diagnosis not present

## 2022-10-14 DIAGNOSIS — N186 End stage renal disease: Secondary | ICD-10-CM | POA: Diagnosis not present

## 2022-10-14 DIAGNOSIS — N2581 Secondary hyperparathyroidism of renal origin: Secondary | ICD-10-CM | POA: Diagnosis not present

## 2022-10-17 DIAGNOSIS — N186 End stage renal disease: Secondary | ICD-10-CM | POA: Diagnosis not present

## 2022-10-17 DIAGNOSIS — N2581 Secondary hyperparathyroidism of renal origin: Secondary | ICD-10-CM | POA: Diagnosis not present

## 2022-10-17 DIAGNOSIS — Z992 Dependence on renal dialysis: Secondary | ICD-10-CM | POA: Diagnosis not present

## 2022-10-19 DIAGNOSIS — N2581 Secondary hyperparathyroidism of renal origin: Secondary | ICD-10-CM | POA: Diagnosis not present

## 2022-10-19 DIAGNOSIS — Z992 Dependence on renal dialysis: Secondary | ICD-10-CM | POA: Diagnosis not present

## 2022-10-19 DIAGNOSIS — N186 End stage renal disease: Secondary | ICD-10-CM | POA: Diagnosis not present

## 2022-10-20 DIAGNOSIS — Z992 Dependence on renal dialysis: Secondary | ICD-10-CM | POA: Diagnosis not present

## 2022-10-20 DIAGNOSIS — N186 End stage renal disease: Secondary | ICD-10-CM | POA: Diagnosis not present

## 2022-10-21 DIAGNOSIS — Z992 Dependence on renal dialysis: Secondary | ICD-10-CM | POA: Diagnosis not present

## 2022-10-21 DIAGNOSIS — N186 End stage renal disease: Secondary | ICD-10-CM | POA: Diagnosis not present

## 2022-10-21 DIAGNOSIS — N2581 Secondary hyperparathyroidism of renal origin: Secondary | ICD-10-CM | POA: Diagnosis not present

## 2022-10-24 DIAGNOSIS — N186 End stage renal disease: Secondary | ICD-10-CM | POA: Diagnosis not present

## 2022-10-24 DIAGNOSIS — N2581 Secondary hyperparathyroidism of renal origin: Secondary | ICD-10-CM | POA: Diagnosis not present

## 2022-10-24 DIAGNOSIS — Z992 Dependence on renal dialysis: Secondary | ICD-10-CM | POA: Diagnosis not present

## 2022-10-26 DIAGNOSIS — N186 End stage renal disease: Secondary | ICD-10-CM | POA: Diagnosis not present

## 2022-10-26 DIAGNOSIS — N2581 Secondary hyperparathyroidism of renal origin: Secondary | ICD-10-CM | POA: Diagnosis not present

## 2022-10-26 DIAGNOSIS — Z992 Dependence on renal dialysis: Secondary | ICD-10-CM | POA: Diagnosis not present

## 2022-10-28 DIAGNOSIS — N186 End stage renal disease: Secondary | ICD-10-CM | POA: Diagnosis not present

## 2022-10-28 DIAGNOSIS — N2581 Secondary hyperparathyroidism of renal origin: Secondary | ICD-10-CM | POA: Diagnosis not present

## 2022-10-28 DIAGNOSIS — Z992 Dependence on renal dialysis: Secondary | ICD-10-CM | POA: Diagnosis not present

## 2022-10-31 DIAGNOSIS — N186 End stage renal disease: Secondary | ICD-10-CM | POA: Diagnosis not present

## 2022-10-31 DIAGNOSIS — Z992 Dependence on renal dialysis: Secondary | ICD-10-CM | POA: Diagnosis not present

## 2022-10-31 DIAGNOSIS — N2581 Secondary hyperparathyroidism of renal origin: Secondary | ICD-10-CM | POA: Diagnosis not present

## 2022-11-02 DIAGNOSIS — N2581 Secondary hyperparathyroidism of renal origin: Secondary | ICD-10-CM | POA: Diagnosis not present

## 2022-11-02 DIAGNOSIS — N186 End stage renal disease: Secondary | ICD-10-CM | POA: Diagnosis not present

## 2022-11-02 DIAGNOSIS — Z992 Dependence on renal dialysis: Secondary | ICD-10-CM | POA: Diagnosis not present

## 2022-11-03 DIAGNOSIS — I150 Renovascular hypertension: Secondary | ICD-10-CM | POA: Diagnosis not present

## 2022-11-03 DIAGNOSIS — E785 Hyperlipidemia, unspecified: Secondary | ICD-10-CM | POA: Diagnosis not present

## 2022-11-03 DIAGNOSIS — I63449 Cerebral infarction due to embolism of unspecified cerebellar artery: Secondary | ICD-10-CM | POA: Diagnosis not present

## 2022-11-03 DIAGNOSIS — R9439 Abnormal result of other cardiovascular function study: Secondary | ICD-10-CM | POA: Diagnosis not present

## 2022-11-03 DIAGNOSIS — I428 Other cardiomyopathies: Secondary | ICD-10-CM | POA: Diagnosis not present

## 2022-11-04 DIAGNOSIS — N186 End stage renal disease: Secondary | ICD-10-CM | POA: Diagnosis not present

## 2022-11-04 DIAGNOSIS — Z992 Dependence on renal dialysis: Secondary | ICD-10-CM | POA: Diagnosis not present

## 2022-11-04 DIAGNOSIS — N2581 Secondary hyperparathyroidism of renal origin: Secondary | ICD-10-CM | POA: Diagnosis not present

## 2022-11-07 DIAGNOSIS — Z992 Dependence on renal dialysis: Secondary | ICD-10-CM | POA: Diagnosis not present

## 2022-11-07 DIAGNOSIS — N2581 Secondary hyperparathyroidism of renal origin: Secondary | ICD-10-CM | POA: Diagnosis not present

## 2022-11-07 DIAGNOSIS — N186 End stage renal disease: Secondary | ICD-10-CM | POA: Diagnosis not present

## 2022-11-09 DIAGNOSIS — N2581 Secondary hyperparathyroidism of renal origin: Secondary | ICD-10-CM | POA: Diagnosis not present

## 2022-11-09 DIAGNOSIS — Z992 Dependence on renal dialysis: Secondary | ICD-10-CM | POA: Diagnosis not present

## 2022-11-09 DIAGNOSIS — N186 End stage renal disease: Secondary | ICD-10-CM | POA: Diagnosis not present

## 2022-11-12 DIAGNOSIS — N186 End stage renal disease: Secondary | ICD-10-CM | POA: Diagnosis not present

## 2022-11-12 DIAGNOSIS — Z992 Dependence on renal dialysis: Secondary | ICD-10-CM | POA: Diagnosis not present

## 2022-11-12 DIAGNOSIS — N2581 Secondary hyperparathyroidism of renal origin: Secondary | ICD-10-CM | POA: Diagnosis not present

## 2022-11-15 DIAGNOSIS — N2581 Secondary hyperparathyroidism of renal origin: Secondary | ICD-10-CM | POA: Diagnosis not present

## 2022-11-15 DIAGNOSIS — N186 End stage renal disease: Secondary | ICD-10-CM | POA: Diagnosis not present

## 2022-11-15 DIAGNOSIS — Z992 Dependence on renal dialysis: Secondary | ICD-10-CM | POA: Diagnosis not present

## 2022-11-16 DIAGNOSIS — N2581 Secondary hyperparathyroidism of renal origin: Secondary | ICD-10-CM | POA: Diagnosis not present

## 2022-11-16 DIAGNOSIS — N186 End stage renal disease: Secondary | ICD-10-CM | POA: Diagnosis not present

## 2022-11-16 DIAGNOSIS — Z992 Dependence on renal dialysis: Secondary | ICD-10-CM | POA: Diagnosis not present

## 2022-11-18 DIAGNOSIS — N186 End stage renal disease: Secondary | ICD-10-CM | POA: Diagnosis not present

## 2022-11-18 DIAGNOSIS — N2581 Secondary hyperparathyroidism of renal origin: Secondary | ICD-10-CM | POA: Diagnosis not present

## 2022-11-18 DIAGNOSIS — Z992 Dependence on renal dialysis: Secondary | ICD-10-CM | POA: Diagnosis not present

## 2022-11-20 DIAGNOSIS — N186 End stage renal disease: Secondary | ICD-10-CM | POA: Diagnosis not present

## 2022-11-20 DIAGNOSIS — Z992 Dependence on renal dialysis: Secondary | ICD-10-CM | POA: Diagnosis not present

## 2022-11-21 DIAGNOSIS — N186 End stage renal disease: Secondary | ICD-10-CM | POA: Diagnosis not present

## 2022-11-21 DIAGNOSIS — Z992 Dependence on renal dialysis: Secondary | ICD-10-CM | POA: Diagnosis not present

## 2022-11-23 DIAGNOSIS — N186 End stage renal disease: Secondary | ICD-10-CM | POA: Diagnosis not present

## 2022-11-23 DIAGNOSIS — Z992 Dependence on renal dialysis: Secondary | ICD-10-CM | POA: Diagnosis not present

## 2022-11-25 DIAGNOSIS — Z992 Dependence on renal dialysis: Secondary | ICD-10-CM | POA: Diagnosis not present

## 2022-11-25 DIAGNOSIS — N186 End stage renal disease: Secondary | ICD-10-CM | POA: Diagnosis not present

## 2022-11-28 DIAGNOSIS — N186 End stage renal disease: Secondary | ICD-10-CM | POA: Diagnosis not present

## 2022-11-28 DIAGNOSIS — Z992 Dependence on renal dialysis: Secondary | ICD-10-CM | POA: Diagnosis not present

## 2022-11-29 DIAGNOSIS — R9439 Abnormal result of other cardiovascular function study: Secondary | ICD-10-CM | POA: Diagnosis not present

## 2022-11-29 DIAGNOSIS — Z01810 Encounter for preprocedural cardiovascular examination: Secondary | ICD-10-CM | POA: Diagnosis not present

## 2022-11-29 DIAGNOSIS — I2510 Atherosclerotic heart disease of native coronary artery without angina pectoris: Secondary | ICD-10-CM | POA: Diagnosis not present

## 2022-11-29 DIAGNOSIS — I1311 Hypertensive heart and chronic kidney disease without heart failure, with stage 5 chronic kidney disease, or end stage renal disease: Secondary | ICD-10-CM | POA: Diagnosis not present

## 2022-11-29 DIAGNOSIS — E785 Hyperlipidemia, unspecified: Secondary | ICD-10-CM | POA: Diagnosis not present

## 2022-11-29 DIAGNOSIS — N186 End stage renal disease: Secondary | ICD-10-CM | POA: Diagnosis not present

## 2022-11-29 DIAGNOSIS — I428 Other cardiomyopathies: Secondary | ICD-10-CM | POA: Diagnosis not present

## 2022-11-29 DIAGNOSIS — R0600 Dyspnea, unspecified: Secondary | ICD-10-CM | POA: Diagnosis not present

## 2022-11-29 DIAGNOSIS — I509 Heart failure, unspecified: Secondary | ICD-10-CM | POA: Diagnosis not present

## 2022-11-30 DIAGNOSIS — Z992 Dependence on renal dialysis: Secondary | ICD-10-CM | POA: Diagnosis not present

## 2022-11-30 DIAGNOSIS — N186 End stage renal disease: Secondary | ICD-10-CM | POA: Diagnosis not present

## 2022-12-02 DIAGNOSIS — N186 End stage renal disease: Secondary | ICD-10-CM | POA: Diagnosis not present

## 2022-12-02 DIAGNOSIS — Z992 Dependence on renal dialysis: Secondary | ICD-10-CM | POA: Diagnosis not present

## 2022-12-05 DIAGNOSIS — Z992 Dependence on renal dialysis: Secondary | ICD-10-CM | POA: Diagnosis not present

## 2022-12-05 DIAGNOSIS — N186 End stage renal disease: Secondary | ICD-10-CM | POA: Diagnosis not present

## 2022-12-07 DIAGNOSIS — Z992 Dependence on renal dialysis: Secondary | ICD-10-CM | POA: Diagnosis not present

## 2022-12-07 DIAGNOSIS — N186 End stage renal disease: Secondary | ICD-10-CM | POA: Diagnosis not present

## 2022-12-09 DIAGNOSIS — Z992 Dependence on renal dialysis: Secondary | ICD-10-CM | POA: Diagnosis not present

## 2022-12-09 DIAGNOSIS — N186 End stage renal disease: Secondary | ICD-10-CM | POA: Diagnosis not present

## 2022-12-12 DIAGNOSIS — N186 End stage renal disease: Secondary | ICD-10-CM | POA: Diagnosis not present

## 2022-12-12 DIAGNOSIS — Z992 Dependence on renal dialysis: Secondary | ICD-10-CM | POA: Diagnosis not present

## 2022-12-14 DIAGNOSIS — Z992 Dependence on renal dialysis: Secondary | ICD-10-CM | POA: Diagnosis not present

## 2022-12-14 DIAGNOSIS — N186 End stage renal disease: Secondary | ICD-10-CM | POA: Diagnosis not present

## 2022-12-16 DIAGNOSIS — N186 End stage renal disease: Secondary | ICD-10-CM | POA: Diagnosis not present

## 2022-12-16 DIAGNOSIS — Z992 Dependence on renal dialysis: Secondary | ICD-10-CM | POA: Diagnosis not present

## 2022-12-19 DIAGNOSIS — Z992 Dependence on renal dialysis: Secondary | ICD-10-CM | POA: Diagnosis not present

## 2022-12-19 DIAGNOSIS — N186 End stage renal disease: Secondary | ICD-10-CM | POA: Diagnosis not present

## 2022-12-21 DIAGNOSIS — N186 End stage renal disease: Secondary | ICD-10-CM | POA: Diagnosis not present

## 2022-12-21 DIAGNOSIS — Z992 Dependence on renal dialysis: Secondary | ICD-10-CM | POA: Diagnosis not present

## 2022-12-23 DIAGNOSIS — Z992 Dependence on renal dialysis: Secondary | ICD-10-CM | POA: Diagnosis not present

## 2022-12-23 DIAGNOSIS — N186 End stage renal disease: Secondary | ICD-10-CM | POA: Diagnosis not present

## 2022-12-26 DIAGNOSIS — Z992 Dependence on renal dialysis: Secondary | ICD-10-CM | POA: Diagnosis not present

## 2022-12-26 DIAGNOSIS — N186 End stage renal disease: Secondary | ICD-10-CM | POA: Diagnosis not present

## 2022-12-28 DIAGNOSIS — Z992 Dependence on renal dialysis: Secondary | ICD-10-CM | POA: Diagnosis not present

## 2022-12-28 DIAGNOSIS — N186 End stage renal disease: Secondary | ICD-10-CM | POA: Diagnosis not present

## 2022-12-30 DIAGNOSIS — N186 End stage renal disease: Secondary | ICD-10-CM | POA: Diagnosis not present

## 2022-12-30 DIAGNOSIS — Z992 Dependence on renal dialysis: Secondary | ICD-10-CM | POA: Diagnosis not present

## 2023-01-02 DIAGNOSIS — N186 End stage renal disease: Secondary | ICD-10-CM | POA: Diagnosis not present

## 2023-01-02 DIAGNOSIS — Z992 Dependence on renal dialysis: Secondary | ICD-10-CM | POA: Diagnosis not present

## 2023-01-04 DIAGNOSIS — N186 End stage renal disease: Secondary | ICD-10-CM | POA: Diagnosis not present

## 2023-01-04 DIAGNOSIS — Z992 Dependence on renal dialysis: Secondary | ICD-10-CM | POA: Diagnosis not present

## 2023-01-05 ENCOUNTER — Other Ambulatory Visit (HOSPITAL_COMMUNITY): Payer: Self-pay | Admitting: Family Medicine

## 2023-01-05 DIAGNOSIS — N6310 Unspecified lump in the right breast, unspecified quadrant: Secondary | ICD-10-CM

## 2023-01-06 DIAGNOSIS — Z992 Dependence on renal dialysis: Secondary | ICD-10-CM | POA: Diagnosis not present

## 2023-01-06 DIAGNOSIS — N186 End stage renal disease: Secondary | ICD-10-CM | POA: Diagnosis not present

## 2023-01-09 DIAGNOSIS — Z992 Dependence on renal dialysis: Secondary | ICD-10-CM | POA: Diagnosis not present

## 2023-01-09 DIAGNOSIS — N186 End stage renal disease: Secondary | ICD-10-CM | POA: Diagnosis not present

## 2023-01-11 DIAGNOSIS — Z992 Dependence on renal dialysis: Secondary | ICD-10-CM | POA: Diagnosis not present

## 2023-01-11 DIAGNOSIS — N186 End stage renal disease: Secondary | ICD-10-CM | POA: Diagnosis not present

## 2023-01-13 DIAGNOSIS — N186 End stage renal disease: Secondary | ICD-10-CM | POA: Diagnosis not present

## 2023-01-13 DIAGNOSIS — Z992 Dependence on renal dialysis: Secondary | ICD-10-CM | POA: Diagnosis not present

## 2023-01-16 DIAGNOSIS — Z992 Dependence on renal dialysis: Secondary | ICD-10-CM | POA: Diagnosis not present

## 2023-01-16 DIAGNOSIS — N186 End stage renal disease: Secondary | ICD-10-CM | POA: Diagnosis not present

## 2023-01-17 ENCOUNTER — Ambulatory Visit (HOSPITAL_COMMUNITY)
Admission: RE | Admit: 2023-01-17 | Discharge: 2023-01-17 | Disposition: A | Discharge status: Home / Self Care | Payer: Medicare HMO | Source: Ambulatory Visit | Attending: Family Medicine | Admitting: Family Medicine

## 2023-01-17 ENCOUNTER — Encounter (HOSPITAL_COMMUNITY): Payer: Self-pay

## 2023-01-17 ENCOUNTER — Ambulatory Visit (HOSPITAL_COMMUNITY)
Admission: RE | Admit: 2023-01-17 | Discharge: 2023-01-17 | Discharge status: Home / Self Care | Payer: Medicare HMO | Source: Ambulatory Visit | Attending: Family Medicine | Admitting: Family Medicine

## 2023-01-17 DIAGNOSIS — N6310 Unspecified lump in the right breast, unspecified quadrant: Secondary | ICD-10-CM | POA: Insufficient documentation

## 2023-01-17 DIAGNOSIS — Z1239 Encounter for other screening for malignant neoplasm of breast: Secondary | ICD-10-CM | POA: Insufficient documentation

## 2023-01-17 DIAGNOSIS — R92323 Mammographic fibroglandular density, bilateral breasts: Secondary | ICD-10-CM | POA: Insufficient documentation

## 2023-01-17 DIAGNOSIS — R928 Other abnormal and inconclusive findings on diagnostic imaging of breast: Secondary | ICD-10-CM | POA: Diagnosis not present

## 2023-01-18 DIAGNOSIS — Z992 Dependence on renal dialysis: Secondary | ICD-10-CM | POA: Diagnosis not present

## 2023-01-18 DIAGNOSIS — N186 End stage renal disease: Secondary | ICD-10-CM | POA: Diagnosis not present

## 2023-01-19 DIAGNOSIS — Z6831 Body mass index (BMI) 31.0-31.9, adult: Secondary | ICD-10-CM | POA: Diagnosis not present

## 2023-01-19 DIAGNOSIS — E1122 Type 2 diabetes mellitus with diabetic chronic kidney disease: Secondary | ICD-10-CM | POA: Diagnosis not present

## 2023-01-19 DIAGNOSIS — E6609 Other obesity due to excess calories: Secondary | ICD-10-CM | POA: Diagnosis not present

## 2023-01-19 DIAGNOSIS — E782 Mixed hyperlipidemia: Secondary | ICD-10-CM | POA: Diagnosis not present

## 2023-01-19 DIAGNOSIS — E119 Type 2 diabetes mellitus without complications: Secondary | ICD-10-CM | POA: Diagnosis not present

## 2023-01-19 DIAGNOSIS — Z1331 Encounter for screening for depression: Secondary | ICD-10-CM | POA: Diagnosis not present

## 2023-01-19 DIAGNOSIS — Z0001 Encounter for general adult medical examination with abnormal findings: Secondary | ICD-10-CM | POA: Diagnosis not present

## 2023-01-19 DIAGNOSIS — N186 End stage renal disease: Secondary | ICD-10-CM | POA: Diagnosis not present

## 2023-01-19 DIAGNOSIS — Z992 Dependence on renal dialysis: Secondary | ICD-10-CM | POA: Diagnosis not present

## 2023-01-19 DIAGNOSIS — I10 Essential (primary) hypertension: Secondary | ICD-10-CM | POA: Diagnosis not present

## 2023-01-20 DIAGNOSIS — Z992 Dependence on renal dialysis: Secondary | ICD-10-CM | POA: Diagnosis not present

## 2023-01-20 DIAGNOSIS — N186 End stage renal disease: Secondary | ICD-10-CM | POA: Diagnosis not present

## 2023-01-23 DIAGNOSIS — Z992 Dependence on renal dialysis: Secondary | ICD-10-CM | POA: Diagnosis not present

## 2023-01-23 DIAGNOSIS — N186 End stage renal disease: Secondary | ICD-10-CM | POA: Diagnosis not present

## 2023-01-25 DIAGNOSIS — Z992 Dependence on renal dialysis: Secondary | ICD-10-CM | POA: Diagnosis not present

## 2023-01-25 DIAGNOSIS — N186 End stage renal disease: Secondary | ICD-10-CM | POA: Diagnosis not present

## 2023-01-27 DIAGNOSIS — N186 End stage renal disease: Secondary | ICD-10-CM | POA: Diagnosis not present

## 2023-01-27 DIAGNOSIS — Z992 Dependence on renal dialysis: Secondary | ICD-10-CM | POA: Diagnosis not present

## 2023-01-30 DIAGNOSIS — N186 End stage renal disease: Secondary | ICD-10-CM | POA: Diagnosis not present

## 2023-01-30 DIAGNOSIS — Z992 Dependence on renal dialysis: Secondary | ICD-10-CM | POA: Diagnosis not present

## 2023-02-01 DIAGNOSIS — N186 End stage renal disease: Secondary | ICD-10-CM | POA: Diagnosis not present

## 2023-02-01 DIAGNOSIS — Z992 Dependence on renal dialysis: Secondary | ICD-10-CM | POA: Diagnosis not present

## 2023-02-03 DIAGNOSIS — Z992 Dependence on renal dialysis: Secondary | ICD-10-CM | POA: Diagnosis not present

## 2023-02-03 DIAGNOSIS — N186 End stage renal disease: Secondary | ICD-10-CM | POA: Diagnosis not present

## 2023-02-06 DIAGNOSIS — N186 End stage renal disease: Secondary | ICD-10-CM | POA: Diagnosis not present

## 2023-02-06 DIAGNOSIS — Z992 Dependence on renal dialysis: Secondary | ICD-10-CM | POA: Diagnosis not present

## 2023-02-08 DIAGNOSIS — N186 End stage renal disease: Secondary | ICD-10-CM | POA: Diagnosis not present

## 2023-02-08 DIAGNOSIS — Z992 Dependence on renal dialysis: Secondary | ICD-10-CM | POA: Diagnosis not present

## 2023-02-10 DIAGNOSIS — N186 End stage renal disease: Secondary | ICD-10-CM | POA: Diagnosis not present

## 2023-02-10 DIAGNOSIS — Z992 Dependence on renal dialysis: Secondary | ICD-10-CM | POA: Diagnosis not present

## 2023-02-13 DIAGNOSIS — N186 End stage renal disease: Secondary | ICD-10-CM | POA: Diagnosis not present

## 2023-02-13 DIAGNOSIS — Z992 Dependence on renal dialysis: Secondary | ICD-10-CM | POA: Diagnosis not present

## 2023-02-15 DIAGNOSIS — Z992 Dependence on renal dialysis: Secondary | ICD-10-CM | POA: Diagnosis not present

## 2023-02-15 DIAGNOSIS — N186 End stage renal disease: Secondary | ICD-10-CM | POA: Diagnosis not present

## 2023-02-17 DIAGNOSIS — Z992 Dependence on renal dialysis: Secondary | ICD-10-CM | POA: Diagnosis not present

## 2023-02-17 DIAGNOSIS — N186 End stage renal disease: Secondary | ICD-10-CM | POA: Diagnosis not present

## 2023-02-19 DIAGNOSIS — N186 End stage renal disease: Secondary | ICD-10-CM | POA: Diagnosis not present

## 2023-02-19 DIAGNOSIS — Z992 Dependence on renal dialysis: Secondary | ICD-10-CM | POA: Diagnosis not present

## 2023-02-20 DIAGNOSIS — N186 End stage renal disease: Secondary | ICD-10-CM | POA: Diagnosis not present

## 2023-02-20 DIAGNOSIS — Z992 Dependence on renal dialysis: Secondary | ICD-10-CM | POA: Diagnosis not present

## 2023-02-22 DIAGNOSIS — N186 End stage renal disease: Secondary | ICD-10-CM | POA: Diagnosis not present

## 2023-02-22 DIAGNOSIS — Z992 Dependence on renal dialysis: Secondary | ICD-10-CM | POA: Diagnosis not present

## 2023-02-24 DIAGNOSIS — N186 End stage renal disease: Secondary | ICD-10-CM | POA: Diagnosis not present

## 2023-02-24 DIAGNOSIS — Z992 Dependence on renal dialysis: Secondary | ICD-10-CM | POA: Diagnosis not present

## 2023-02-27 DIAGNOSIS — Z992 Dependence on renal dialysis: Secondary | ICD-10-CM | POA: Diagnosis not present

## 2023-02-27 DIAGNOSIS — N186 End stage renal disease: Secondary | ICD-10-CM | POA: Diagnosis not present

## 2023-03-01 DIAGNOSIS — N186 End stage renal disease: Secondary | ICD-10-CM | POA: Diagnosis not present

## 2023-03-01 DIAGNOSIS — Z992 Dependence on renal dialysis: Secondary | ICD-10-CM | POA: Diagnosis not present

## 2023-03-03 DIAGNOSIS — N186 End stage renal disease: Secondary | ICD-10-CM | POA: Diagnosis not present

## 2023-03-03 DIAGNOSIS — Z992 Dependence on renal dialysis: Secondary | ICD-10-CM | POA: Diagnosis not present

## 2023-03-06 DIAGNOSIS — Z992 Dependence on renal dialysis: Secondary | ICD-10-CM | POA: Diagnosis not present

## 2023-03-06 DIAGNOSIS — N186 End stage renal disease: Secondary | ICD-10-CM | POA: Diagnosis not present

## 2023-03-08 DIAGNOSIS — N186 End stage renal disease: Secondary | ICD-10-CM | POA: Diagnosis not present

## 2023-03-08 DIAGNOSIS — Z992 Dependence on renal dialysis: Secondary | ICD-10-CM | POA: Diagnosis not present

## 2023-03-10 DIAGNOSIS — Z992 Dependence on renal dialysis: Secondary | ICD-10-CM | POA: Diagnosis not present

## 2023-03-10 DIAGNOSIS — N186 End stage renal disease: Secondary | ICD-10-CM | POA: Diagnosis not present

## 2023-03-13 DIAGNOSIS — N186 End stage renal disease: Secondary | ICD-10-CM | POA: Diagnosis not present

## 2023-03-13 DIAGNOSIS — Z992 Dependence on renal dialysis: Secondary | ICD-10-CM | POA: Diagnosis not present

## 2023-03-15 ENCOUNTER — Other Ambulatory Visit: Payer: Self-pay

## 2023-03-15 DIAGNOSIS — Z992 Dependence on renal dialysis: Secondary | ICD-10-CM | POA: Diagnosis not present

## 2023-03-15 DIAGNOSIS — N186 End stage renal disease: Secondary | ICD-10-CM | POA: Diagnosis not present

## 2023-03-17 DIAGNOSIS — N186 End stage renal disease: Secondary | ICD-10-CM | POA: Diagnosis not present

## 2023-03-17 DIAGNOSIS — Z992 Dependence on renal dialysis: Secondary | ICD-10-CM | POA: Diagnosis not present

## 2023-03-20 DIAGNOSIS — Z992 Dependence on renal dialysis: Secondary | ICD-10-CM | POA: Diagnosis not present

## 2023-03-20 DIAGNOSIS — N186 End stage renal disease: Secondary | ICD-10-CM | POA: Diagnosis not present

## 2023-03-21 DIAGNOSIS — Z992 Dependence on renal dialysis: Secondary | ICD-10-CM | POA: Diagnosis not present

## 2023-03-21 DIAGNOSIS — N186 End stage renal disease: Secondary | ICD-10-CM | POA: Diagnosis not present

## 2023-03-22 DIAGNOSIS — N186 End stage renal disease: Secondary | ICD-10-CM | POA: Diagnosis not present

## 2023-03-22 DIAGNOSIS — Z992 Dependence on renal dialysis: Secondary | ICD-10-CM | POA: Diagnosis not present

## 2023-03-24 DIAGNOSIS — Z992 Dependence on renal dialysis: Secondary | ICD-10-CM | POA: Diagnosis not present

## 2023-03-24 DIAGNOSIS — N186 End stage renal disease: Secondary | ICD-10-CM | POA: Diagnosis not present

## 2023-03-27 DIAGNOSIS — Z992 Dependence on renal dialysis: Secondary | ICD-10-CM | POA: Diagnosis not present

## 2023-03-27 DIAGNOSIS — N186 End stage renal disease: Secondary | ICD-10-CM | POA: Diagnosis not present

## 2023-03-29 DIAGNOSIS — N186 End stage renal disease: Secondary | ICD-10-CM | POA: Diagnosis not present

## 2023-03-29 DIAGNOSIS — Z992 Dependence on renal dialysis: Secondary | ICD-10-CM | POA: Diagnosis not present

## 2023-03-31 DIAGNOSIS — Z992 Dependence on renal dialysis: Secondary | ICD-10-CM | POA: Diagnosis not present

## 2023-03-31 DIAGNOSIS — N186 End stage renal disease: Secondary | ICD-10-CM | POA: Diagnosis not present

## 2023-04-03 DIAGNOSIS — Z992 Dependence on renal dialysis: Secondary | ICD-10-CM | POA: Diagnosis not present

## 2023-04-03 DIAGNOSIS — N186 End stage renal disease: Secondary | ICD-10-CM | POA: Diagnosis not present

## 2023-04-05 ENCOUNTER — Ambulatory Visit (INDEPENDENT_AMBULATORY_CARE_PROVIDER_SITE_OTHER): Payer: Medicare HMO

## 2023-04-05 ENCOUNTER — Encounter: Payer: Self-pay | Admitting: Vascular Surgery

## 2023-04-05 ENCOUNTER — Ambulatory Visit (INDEPENDENT_AMBULATORY_CARE_PROVIDER_SITE_OTHER): Payer: Medicare HMO | Admitting: Vascular Surgery

## 2023-04-05 VITALS — BP 110/66 | HR 85 | Temp 98.2°F | Ht 65.0 in | Wt 193.8 lb

## 2023-04-05 DIAGNOSIS — Z992 Dependence on renal dialysis: Secondary | ICD-10-CM

## 2023-04-05 DIAGNOSIS — N186 End stage renal disease: Secondary | ICD-10-CM

## 2023-04-05 NOTE — Progress Notes (Signed)
[facility] [place]  Patient name: Angel Davis MRN: [id number] DOB: 1951/11/13 Sex: female  REASON FOR CONSULT: Evaluate left arm AV fistula with high pressures on the machine  HPI: Angel Davis is a 72 y.o. female, who is here today for evaluation.  She initially had left arm brachiocephalic fistula placed in [facility] in July 2021.  She had a revision 1 year later due to tortuosity.  She undergoes dialysis at [facility].  She has had difficulty with high arterial and venous pressures and is seen today for further evaluation.  Past Medical History:  Diagnosis Date   Abnormal EKG    Acute gout 06/29/2014   AKI (acute kidney injury) (HCC) 11/23/2018   12/16-12/31/2019 [facility] : peak Creatinine 4.39   Anemia    H&H of 10.3/33.5 in 12/2011   Cerebral infarction [facility]) 11/23/2018   12/16-12/31/2019 [facility] MRI performed for encephalopathy in the setting of strep pneumoniae bacteremia and acute hypoxic respiratory failure with findings of multifocal subacute infarcts Plavix and high dose statin initiated   CKD (chronic kidney disease)    On dialysis M, W, F   Depression    DEPRESSION 03/13/2008   Qualifier: Diagnosis of  By: Patterson Hammersmith RN, [place]     Essential hypertension 03/13/2008   Lab: Normal CMet in 12/2011 except creatinine of 1.35    FATIGUE 02/25/2009   Qualifier: Diagnosis of  By: Lodema Hong MD, Margaret     Gout flare 04/28/2014   HAND PAIN, RIGHT 02/25/2009   Qualifier: Diagnosis of  By: Lodema Hong MD, Margaret     Hyperlipidemia    Hypertension    Metabolic syndrome X 06/02/2013   Obesity    OBESITY 03/13/2008   Qualifier: Diagnosis of  By: Patterson Hammersmith RN, [place]an[place]     Pansinusitis 11/23/2018   12/16-12/31/2019 [facility] CT revealed complete opacification of bilateral maxillary/ethmoid sinus and right frontal sinus.Effusion within bilateral middle air spaces ENT recommended antibiotics w/o surgical intervention    Pre-diabetes    Prediabetes 01/08/2012   HBa1C is 6.4 in 04/2014    Renal insufficiency 02/01/2012   Respiratory failure with hypoxia (HCC) 11/23/2018   12/16-12/31/2009 [facility] with hypoxic respiratory failure in the context of strep pneumonia bacteremia (on blood cultures collected in [place] [facility]) Intubated in [place] [facility] and transferred to [facility]     Tachycardia 04/28/2014   Thrombocytosis 11/25/2018   11/16/2018 platelet count 685,000   Vitamin D deficiency    Vitamin D deficiency 01/08/2012    Family History  Problem Relation Age of Onset   Heart attack Mother    Heart attack Father    Cancer Sister    Hypertension Sister    Hypertension Brother    Colon cancer Neg Hx     SOCIAL HISTORY: Social History   Socioeconomic History   Marital status: Divorced    Spouse name: Not on file   Number of children: Not on file   Years of education: Not on file   Highest education level: Not on file  Occupational History   Not on file  Tobacco Use   Smoking status: Never   Smokeless tobacco: Never  Vaping Use   Vaping Use: Never used  Substance and Sexual Activity   Alcohol use: No   Drug use: No   Sexual activity: Not on file  Other Topics Concern   Not on file  Social History Narrative   Not on file   Social Determinants of Corporate investment banker  Strain: Not on file  Food Insecurity: Not on file  Transportation Needs: Not on file  Physical Activity: Not on file  Stress: Not on file  Social Connections: Not on file  Intimate Partner Violence: Not on file    No Known Allergies  Current Outpatient Medications  Medication Sig Dispense Refill   acetaminophen (TYLENOL) 500 MG tablet Take 500-1,000 mg by mouth every 6 (six) hours as needed for moderate pain.     calcitRIOL (ROCALTROL) 0.25 MCG capsule Take 1.25 mcg by mouth. Mon, Wed, Fri     calcium elemental as carbonate (TUMS ULTRA 1000) 400 MG chewable tablet Chew 1,000 mg by mouth 3 (three) times daily  before meals.     carvedilol (COREG) 25 MG tablet Take 25 mg by mouth daily at 6 (six) AM.     cinacalcet (SENSIPAR) 30 MG tablet Take 30 mg by mouth every Monday, Wednesday, and Friday.     clopidogrel (PLAVIX) 75 MG tablet Take 75 mg by mouth daily.     Epoetin Alfa (EPOGEN IJ) Inject as directed. 5000 units Mon, Wed, Fri     escitalopram (LEXAPRO) 10 MG tablet Take 10 mg by mouth daily.     NIFEdipine (PROCARDIA XL/ADALAT-CC) 90 MG 24 hr tablet Take 1 tablet (90 mg total) by mouth daily. 30 tablet 3   UNABLE TO FIND Med Name: Venofer 50mg  once a week at dialysis     No current facility-administered medications for this visit.    REVIEW OF SYSTEMS:  [X]  denotes positive finding, [ ]  denotes negative finding Cardiac  Comments:  Chest pain or chest pressure:    Shortness of breath upon exertion:    Short of breath when lying flat:    Irregular heart rhythm:        Vascular    Pain in calf, thigh, or hip brought on by ambulation:    Pain in feet at night that wakes you up from your sleep:     Blood clot in your veins:    Leg swelling:         Pulmonary    Oxygen at home:    Productive cough:     Wheezing:         Neurologic    Sudden weakness in arms or legs:     Sudden numbness in arms or legs:     Sudden onset of difficulty speaking or slurred speech:    Temporary loss of vision in one eye:     Problems with dizziness:         Gastrointestinal    Blood in stool:     Vomited blood:         Genitourinary    Burning when urinating:     Blood in urine:        Psychiatric    Major depression:         Hematologic    Bleeding problems:    Problems with blood clotting too easily:        Skin    Rashes or ulcers:        Constitutional    Fever or chills:      PHYSICAL EXAM: Vitals:   04/05/23 1434  BP: 110/66  Pulse: 85  Temp: 98.2 F (36.8 C)  SpO2: 95%  Weight: 193 lb 12.8 oz (87.9 kg)  Height: 5\' 5"  (1.651 m)    GENERAL: The patient is a  well-nourished female, in no acute distress. The vital signs are  documented above. CARDIOVASCULAR: She does have significant tortuosity in her upper arm fistula particularly in the lower half.  She has not had any skin breakdown over the fistula.  Does have a good thrill in the fistula. PULMONARY: There is good air exchange  MUSCULOSKELETAL: There are no major deformities or cyanosis. NEUROLOGIC: No focal weakness or paresthesias are detected. SKIN: There are no ulcers or rashes noted. PSYCHIATRIC: The patient has a normal affect.  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  MEDICAL ISSUES: I have recommended formal fistulogram to determine if she has stenosis amenable to endovascular treatment.  She undergoes dialysis on Monday Wednesday Friday so we will coordinate this on a nondialysis day   Larina Earthly, MD [facility] [phone] Pager [phone]  Note: Portions of this report may have been transcribed using voice recognition software.  Every effort has been made to ensure accuracy; however, inadvertent computerized transcription errors may still be present.

## 2023-04-07 DIAGNOSIS — N186 End stage renal disease: Secondary | ICD-10-CM | POA: Diagnosis not present

## 2023-04-07 DIAGNOSIS — Z992 Dependence on renal dialysis: Secondary | ICD-10-CM | POA: Diagnosis not present

## 2023-04-10 DIAGNOSIS — Z992 Dependence on renal dialysis: Secondary | ICD-10-CM | POA: Diagnosis not present

## 2023-04-10 DIAGNOSIS — N186 End stage renal disease: Secondary | ICD-10-CM | POA: Diagnosis not present

## 2023-04-11 ENCOUNTER — Other Ambulatory Visit: Payer: Self-pay

## 2023-04-11 DIAGNOSIS — N186 End stage renal disease: Secondary | ICD-10-CM

## 2023-04-11 MED ORDER — SODIUM CHLORIDE 0.9 % IV SOLN: 250.0000 mL | INTRAVENOUS | Status: AC | PRN

## 2023-04-11 MED ORDER — SODIUM CHLORIDE 0.9% FLUSH: 3.0000 mL | Freq: Two times a day (BID) | INTRAVENOUS | Status: AC

## 2023-04-12 DIAGNOSIS — Z992 Dependence on renal dialysis: Secondary | ICD-10-CM | POA: Diagnosis not present

## 2023-04-12 DIAGNOSIS — N186 End stage renal disease: Secondary | ICD-10-CM | POA: Diagnosis not present

## 2023-04-14 DIAGNOSIS — Z992 Dependence on renal dialysis: Secondary | ICD-10-CM | POA: Diagnosis not present

## 2023-04-14 DIAGNOSIS — N186 End stage renal disease: Secondary | ICD-10-CM | POA: Diagnosis not present

## 2023-04-17 DIAGNOSIS — Z992 Dependence on renal dialysis: Secondary | ICD-10-CM | POA: Diagnosis not present

## 2023-04-17 DIAGNOSIS — N186 End stage renal disease: Secondary | ICD-10-CM | POA: Diagnosis not present

## 2023-04-19 DIAGNOSIS — N186 End stage renal disease: Secondary | ICD-10-CM | POA: Diagnosis not present

## 2023-04-19 DIAGNOSIS — Z992 Dependence on renal dialysis: Secondary | ICD-10-CM | POA: Diagnosis not present

## 2023-04-20 ENCOUNTER — Encounter (HOSPITAL_COMMUNITY): Admission: RE | Payer: Self-pay | Source: Ambulatory Visit

## 2023-04-20 ENCOUNTER — Ambulatory Visit (HOSPITAL_COMMUNITY): Admission: RE | Admit: 2023-04-20 | Payer: Medicare HMO | Source: Ambulatory Visit | Admitting: Vascular Surgery

## 2023-04-20 SURGERY — A/V FISTULAGRAM
Anesthesia: LOCAL | Laterality: Left

## 2023-04-21 DIAGNOSIS — N186 End stage renal disease: Secondary | ICD-10-CM | POA: Diagnosis not present

## 2023-04-21 DIAGNOSIS — Z992 Dependence on renal dialysis: Secondary | ICD-10-CM | POA: Diagnosis not present

## 2023-04-24 DIAGNOSIS — Z992 Dependence on renal dialysis: Secondary | ICD-10-CM | POA: Diagnosis not present

## 2023-04-24 DIAGNOSIS — N186 End stage renal disease: Secondary | ICD-10-CM | POA: Diagnosis not present

## 2023-04-26 DIAGNOSIS — Z992 Dependence on renal dialysis: Secondary | ICD-10-CM | POA: Diagnosis not present

## 2023-04-26 DIAGNOSIS — N186 End stage renal disease: Secondary | ICD-10-CM | POA: Diagnosis not present

## 2023-04-28 DIAGNOSIS — N186 End stage renal disease: Secondary | ICD-10-CM | POA: Diagnosis not present

## 2023-04-28 DIAGNOSIS — Z992 Dependence on renal dialysis: Secondary | ICD-10-CM | POA: Diagnosis not present

## 2023-05-01 DIAGNOSIS — N186 End stage renal disease: Secondary | ICD-10-CM | POA: Diagnosis not present

## 2023-05-01 DIAGNOSIS — Z992 Dependence on renal dialysis: Secondary | ICD-10-CM | POA: Diagnosis not present

## 2023-05-03 DIAGNOSIS — N186 End stage renal disease: Secondary | ICD-10-CM | POA: Diagnosis not present

## 2023-05-03 DIAGNOSIS — Z992 Dependence on renal dialysis: Secondary | ICD-10-CM | POA: Diagnosis not present

## 2023-05-05 DIAGNOSIS — Z992 Dependence on renal dialysis: Secondary | ICD-10-CM | POA: Diagnosis not present

## 2023-05-05 DIAGNOSIS — N186 End stage renal disease: Secondary | ICD-10-CM | POA: Diagnosis not present

## 2023-05-08 DIAGNOSIS — N186 End stage renal disease: Secondary | ICD-10-CM | POA: Diagnosis not present

## 2023-05-08 DIAGNOSIS — Z992 Dependence on renal dialysis: Secondary | ICD-10-CM | POA: Diagnosis not present

## 2023-05-10 DIAGNOSIS — Z992 Dependence on renal dialysis: Secondary | ICD-10-CM | POA: Diagnosis not present

## 2023-05-10 DIAGNOSIS — N186 End stage renal disease: Secondary | ICD-10-CM | POA: Diagnosis not present

## 2023-05-12 DIAGNOSIS — N186 End stage renal disease: Secondary | ICD-10-CM | POA: Diagnosis not present

## 2023-05-12 DIAGNOSIS — Z992 Dependence on renal dialysis: Secondary | ICD-10-CM | POA: Diagnosis not present

## 2023-05-15 DIAGNOSIS — N186 End stage renal disease: Secondary | ICD-10-CM | POA: Diagnosis not present

## 2023-05-15 DIAGNOSIS — Z992 Dependence on renal dialysis: Secondary | ICD-10-CM | POA: Diagnosis not present

## 2023-05-17 DIAGNOSIS — Z992 Dependence on renal dialysis: Secondary | ICD-10-CM | POA: Diagnosis not present

## 2023-05-17 DIAGNOSIS — N186 End stage renal disease: Secondary | ICD-10-CM | POA: Diagnosis not present

## 2023-05-19 DIAGNOSIS — Z992 Dependence on renal dialysis: Secondary | ICD-10-CM | POA: Diagnosis not present

## 2023-05-19 DIAGNOSIS — N186 End stage renal disease: Secondary | ICD-10-CM | POA: Diagnosis not present

## 2023-05-21 DIAGNOSIS — N186 End stage renal disease: Secondary | ICD-10-CM | POA: Diagnosis not present

## 2023-05-21 DIAGNOSIS — Z992 Dependence on renal dialysis: Secondary | ICD-10-CM | POA: Diagnosis not present

## 2023-05-22 DIAGNOSIS — N186 End stage renal disease: Secondary | ICD-10-CM | POA: Diagnosis not present

## 2023-05-22 DIAGNOSIS — Z992 Dependence on renal dialysis: Secondary | ICD-10-CM | POA: Diagnosis not present

## 2023-05-24 DIAGNOSIS — N186 End stage renal disease: Secondary | ICD-10-CM | POA: Diagnosis not present

## 2023-05-24 DIAGNOSIS — Z992 Dependence on renal dialysis: Secondary | ICD-10-CM | POA: Diagnosis not present

## 2023-05-26 DIAGNOSIS — N186 End stage renal disease: Secondary | ICD-10-CM | POA: Diagnosis not present

## 2023-05-26 DIAGNOSIS — Z992 Dependence on renal dialysis: Secondary | ICD-10-CM | POA: Diagnosis not present

## 2023-05-29 DIAGNOSIS — Z992 Dependence on renal dialysis: Secondary | ICD-10-CM | POA: Diagnosis not present

## 2023-05-29 DIAGNOSIS — N186 End stage renal disease: Secondary | ICD-10-CM | POA: Diagnosis not present

## 2023-05-31 DIAGNOSIS — Z992 Dependence on renal dialysis: Secondary | ICD-10-CM | POA: Diagnosis not present

## 2023-05-31 DIAGNOSIS — N186 End stage renal disease: Secondary | ICD-10-CM | POA: Diagnosis not present

## 2023-06-02 DIAGNOSIS — N186 End stage renal disease: Secondary | ICD-10-CM | POA: Diagnosis not present

## 2023-06-02 DIAGNOSIS — Z992 Dependence on renal dialysis: Secondary | ICD-10-CM | POA: Diagnosis not present

## 2023-06-05 DIAGNOSIS — N186 End stage renal disease: Secondary | ICD-10-CM | POA: Diagnosis not present

## 2023-06-05 DIAGNOSIS — Z992 Dependence on renal dialysis: Secondary | ICD-10-CM | POA: Diagnosis not present

## 2023-06-07 DIAGNOSIS — Z992 Dependence on renal dialysis: Secondary | ICD-10-CM | POA: Diagnosis not present

## 2023-06-07 DIAGNOSIS — N186 End stage renal disease: Secondary | ICD-10-CM | POA: Diagnosis not present

## 2023-06-09 DIAGNOSIS — N186 End stage renal disease: Secondary | ICD-10-CM | POA: Diagnosis not present

## 2023-06-09 DIAGNOSIS — Z992 Dependence on renal dialysis: Secondary | ICD-10-CM | POA: Diagnosis not present

## 2023-06-12 DIAGNOSIS — N186 End stage renal disease: Secondary | ICD-10-CM | POA: Diagnosis not present

## 2023-06-12 DIAGNOSIS — Z992 Dependence on renal dialysis: Secondary | ICD-10-CM | POA: Diagnosis not present

## 2023-06-14 DIAGNOSIS — Z992 Dependence on renal dialysis: Secondary | ICD-10-CM | POA: Diagnosis not present

## 2023-06-14 DIAGNOSIS — N186 End stage renal disease: Secondary | ICD-10-CM | POA: Diagnosis not present

## 2023-06-19 DIAGNOSIS — Z992 Dependence on renal dialysis: Secondary | ICD-10-CM | POA: Diagnosis not present

## 2023-06-19 DIAGNOSIS — N186 End stage renal disease: Secondary | ICD-10-CM | POA: Diagnosis not present

## 2023-06-21 DIAGNOSIS — N186 End stage renal disease: Secondary | ICD-10-CM | POA: Diagnosis not present

## 2023-06-21 DIAGNOSIS — Z992 Dependence on renal dialysis: Secondary | ICD-10-CM | POA: Diagnosis not present

## 2023-06-23 DIAGNOSIS — N186 End stage renal disease: Secondary | ICD-10-CM | POA: Diagnosis not present

## 2023-06-23 DIAGNOSIS — Z992 Dependence on renal dialysis: Secondary | ICD-10-CM | POA: Diagnosis not present

## 2023-06-26 DIAGNOSIS — Z992 Dependence on renal dialysis: Secondary | ICD-10-CM | POA: Diagnosis not present

## 2023-06-26 DIAGNOSIS — N186 End stage renal disease: Secondary | ICD-10-CM | POA: Diagnosis not present

## 2023-06-28 DIAGNOSIS — Z992 Dependence on renal dialysis: Secondary | ICD-10-CM | POA: Diagnosis not present

## 2023-06-28 DIAGNOSIS — N186 End stage renal disease: Secondary | ICD-10-CM | POA: Diagnosis not present

## 2023-06-30 DIAGNOSIS — Z992 Dependence on renal dialysis: Secondary | ICD-10-CM | POA: Diagnosis not present

## 2023-06-30 DIAGNOSIS — N186 End stage renal disease: Secondary | ICD-10-CM | POA: Diagnosis not present

## 2023-07-03 DIAGNOSIS — N186 End stage renal disease: Secondary | ICD-10-CM | POA: Diagnosis not present

## 2023-07-03 DIAGNOSIS — Z992 Dependence on renal dialysis: Secondary | ICD-10-CM | POA: Diagnosis not present

## 2023-07-05 DIAGNOSIS — N186 End stage renal disease: Secondary | ICD-10-CM | POA: Diagnosis not present

## 2023-07-05 DIAGNOSIS — Z992 Dependence on renal dialysis: Secondary | ICD-10-CM | POA: Diagnosis not present

## 2023-07-07 DIAGNOSIS — Z992 Dependence on renal dialysis: Secondary | ICD-10-CM | POA: Diagnosis not present

## 2023-07-07 DIAGNOSIS — N186 End stage renal disease: Secondary | ICD-10-CM | POA: Diagnosis not present

## 2023-07-10 DIAGNOSIS — N186 End stage renal disease: Secondary | ICD-10-CM | POA: Diagnosis not present

## 2023-07-10 DIAGNOSIS — Z992 Dependence on renal dialysis: Secondary | ICD-10-CM | POA: Diagnosis not present

## 2023-07-12 DIAGNOSIS — N186 End stage renal disease: Secondary | ICD-10-CM | POA: Diagnosis not present

## 2023-07-12 DIAGNOSIS — Z992 Dependence on renal dialysis: Secondary | ICD-10-CM | POA: Diagnosis not present

## 2023-07-14 DIAGNOSIS — N186 End stage renal disease: Secondary | ICD-10-CM | POA: Diagnosis not present

## 2023-07-14 DIAGNOSIS — Z992 Dependence on renal dialysis: Secondary | ICD-10-CM | POA: Diagnosis not present

## 2023-07-17 DIAGNOSIS — Z992 Dependence on renal dialysis: Secondary | ICD-10-CM | POA: Diagnosis not present

## 2023-07-17 DIAGNOSIS — N186 End stage renal disease: Secondary | ICD-10-CM | POA: Diagnosis not present

## 2023-07-19 DIAGNOSIS — Z992 Dependence on renal dialysis: Secondary | ICD-10-CM | POA: Diagnosis not present

## 2023-07-19 DIAGNOSIS — N186 End stage renal disease: Secondary | ICD-10-CM | POA: Diagnosis not present

## 2023-07-21 DIAGNOSIS — Z992 Dependence on renal dialysis: Secondary | ICD-10-CM | POA: Diagnosis not present

## 2023-07-21 DIAGNOSIS — N186 End stage renal disease: Secondary | ICD-10-CM | POA: Diagnosis not present

## 2023-07-22 DIAGNOSIS — Z992 Dependence on renal dialysis: Secondary | ICD-10-CM | POA: Diagnosis not present

## 2023-07-22 DIAGNOSIS — N186 End stage renal disease: Secondary | ICD-10-CM | POA: Diagnosis not present

## 2023-07-25 DIAGNOSIS — Z992 Dependence on renal dialysis: Secondary | ICD-10-CM | POA: Diagnosis not present

## 2023-07-25 DIAGNOSIS — N186 End stage renal disease: Secondary | ICD-10-CM | POA: Diagnosis not present

## 2023-07-26 DIAGNOSIS — N186 End stage renal disease: Secondary | ICD-10-CM | POA: Diagnosis not present

## 2023-07-26 DIAGNOSIS — Z992 Dependence on renal dialysis: Secondary | ICD-10-CM | POA: Diagnosis not present

## 2023-07-28 DIAGNOSIS — Z992 Dependence on renal dialysis: Secondary | ICD-10-CM | POA: Diagnosis not present

## 2023-07-28 DIAGNOSIS — N186 End stage renal disease: Secondary | ICD-10-CM | POA: Diagnosis not present

## 2023-07-31 DIAGNOSIS — Z992 Dependence on renal dialysis: Secondary | ICD-10-CM | POA: Diagnosis not present

## 2023-07-31 DIAGNOSIS — N186 End stage renal disease: Secondary | ICD-10-CM | POA: Diagnosis not present

## 2023-08-02 DIAGNOSIS — N186 End stage renal disease: Secondary | ICD-10-CM | POA: Diagnosis not present

## 2023-08-02 DIAGNOSIS — Z992 Dependence on renal dialysis: Secondary | ICD-10-CM | POA: Diagnosis not present

## 2023-08-04 DIAGNOSIS — N186 End stage renal disease: Secondary | ICD-10-CM | POA: Diagnosis not present

## 2023-08-04 DIAGNOSIS — Z992 Dependence on renal dialysis: Secondary | ICD-10-CM | POA: Diagnosis not present

## 2023-08-07 DIAGNOSIS — Z992 Dependence on renal dialysis: Secondary | ICD-10-CM | POA: Diagnosis not present

## 2023-08-07 DIAGNOSIS — N186 End stage renal disease: Secondary | ICD-10-CM | POA: Diagnosis not present

## 2023-08-09 DIAGNOSIS — N186 End stage renal disease: Secondary | ICD-10-CM | POA: Diagnosis not present

## 2023-08-09 DIAGNOSIS — Z992 Dependence on renal dialysis: Secondary | ICD-10-CM | POA: Diagnosis not present

## 2023-08-11 DIAGNOSIS — Z992 Dependence on renal dialysis: Secondary | ICD-10-CM | POA: Diagnosis not present

## 2023-08-11 DIAGNOSIS — N186 End stage renal disease: Secondary | ICD-10-CM | POA: Diagnosis not present

## 2023-08-14 DIAGNOSIS — Z992 Dependence on renal dialysis: Secondary | ICD-10-CM | POA: Diagnosis not present

## 2023-08-14 DIAGNOSIS — N186 End stage renal disease: Secondary | ICD-10-CM | POA: Diagnosis not present

## 2023-08-16 DIAGNOSIS — N186 End stage renal disease: Secondary | ICD-10-CM | POA: Diagnosis not present

## 2023-08-16 DIAGNOSIS — Z992 Dependence on renal dialysis: Secondary | ICD-10-CM | POA: Diagnosis not present

## 2023-08-18 DIAGNOSIS — Z992 Dependence on renal dialysis: Secondary | ICD-10-CM | POA: Diagnosis not present

## 2023-08-18 DIAGNOSIS — N186 End stage renal disease: Secondary | ICD-10-CM | POA: Diagnosis not present

## 2023-08-21 DIAGNOSIS — N186 End stage renal disease: Secondary | ICD-10-CM | POA: Diagnosis not present

## 2023-08-21 DIAGNOSIS — Z992 Dependence on renal dialysis: Secondary | ICD-10-CM | POA: Diagnosis not present

## 2023-08-23 DIAGNOSIS — N186 End stage renal disease: Secondary | ICD-10-CM | POA: Diagnosis not present

## 2023-08-23 DIAGNOSIS — Z992 Dependence on renal dialysis: Secondary | ICD-10-CM | POA: Diagnosis not present

## 2023-08-25 DIAGNOSIS — Z992 Dependence on renal dialysis: Secondary | ICD-10-CM | POA: Diagnosis not present

## 2023-08-25 DIAGNOSIS — N186 End stage renal disease: Secondary | ICD-10-CM | POA: Diagnosis not present

## 2023-08-28 DIAGNOSIS — Z992 Dependence on renal dialysis: Secondary | ICD-10-CM | POA: Diagnosis not present

## 2023-08-28 DIAGNOSIS — N186 End stage renal disease: Secondary | ICD-10-CM | POA: Diagnosis not present

## 2023-08-30 DIAGNOSIS — N186 End stage renal disease: Secondary | ICD-10-CM | POA: Diagnosis not present

## 2023-08-30 DIAGNOSIS — Z992 Dependence on renal dialysis: Secondary | ICD-10-CM | POA: Diagnosis not present

## 2023-09-01 DIAGNOSIS — Z992 Dependence on renal dialysis: Secondary | ICD-10-CM | POA: Diagnosis not present

## 2023-09-01 DIAGNOSIS — N186 End stage renal disease: Secondary | ICD-10-CM | POA: Diagnosis not present

## 2023-09-04 DIAGNOSIS — Z992 Dependence on renal dialysis: Secondary | ICD-10-CM | POA: Diagnosis not present

## 2023-09-04 DIAGNOSIS — N186 End stage renal disease: Secondary | ICD-10-CM | POA: Diagnosis not present

## 2023-09-06 DIAGNOSIS — Z992 Dependence on renal dialysis: Secondary | ICD-10-CM | POA: Diagnosis not present

## 2023-09-06 DIAGNOSIS — N186 End stage renal disease: Secondary | ICD-10-CM | POA: Diagnosis not present

## 2023-09-08 DIAGNOSIS — Z992 Dependence on renal dialysis: Secondary | ICD-10-CM | POA: Diagnosis not present

## 2023-09-08 DIAGNOSIS — N186 End stage renal disease: Secondary | ICD-10-CM | POA: Diagnosis not present

## 2023-09-11 DIAGNOSIS — Z992 Dependence on renal dialysis: Secondary | ICD-10-CM | POA: Diagnosis not present

## 2023-09-11 DIAGNOSIS — N186 End stage renal disease: Secondary | ICD-10-CM | POA: Diagnosis not present

## 2023-09-13 DIAGNOSIS — Z992 Dependence on renal dialysis: Secondary | ICD-10-CM | POA: Diagnosis not present

## 2023-09-13 DIAGNOSIS — N186 End stage renal disease: Secondary | ICD-10-CM | POA: Diagnosis not present

## 2023-09-15 DIAGNOSIS — N186 End stage renal disease: Secondary | ICD-10-CM | POA: Diagnosis not present

## 2023-09-15 DIAGNOSIS — Z992 Dependence on renal dialysis: Secondary | ICD-10-CM | POA: Diagnosis not present

## 2023-09-18 DIAGNOSIS — Z992 Dependence on renal dialysis: Secondary | ICD-10-CM | POA: Diagnosis not present

## 2023-09-18 DIAGNOSIS — N186 End stage renal disease: Secondary | ICD-10-CM | POA: Diagnosis not present

## 2023-09-20 DIAGNOSIS — N186 End stage renal disease: Secondary | ICD-10-CM | POA: Diagnosis not present

## 2023-09-20 DIAGNOSIS — Z992 Dependence on renal dialysis: Secondary | ICD-10-CM | POA: Diagnosis not present

## 2023-09-21 DIAGNOSIS — Z992 Dependence on renal dialysis: Secondary | ICD-10-CM | POA: Diagnosis not present

## 2023-09-21 DIAGNOSIS — N186 End stage renal disease: Secondary | ICD-10-CM | POA: Diagnosis not present

## 2023-09-22 DIAGNOSIS — Z992 Dependence on renal dialysis: Secondary | ICD-10-CM | POA: Diagnosis not present

## 2023-09-22 DIAGNOSIS — N186 End stage renal disease: Secondary | ICD-10-CM | POA: Diagnosis not present

## 2023-09-25 DIAGNOSIS — N186 End stage renal disease: Secondary | ICD-10-CM | POA: Diagnosis not present

## 2023-09-25 DIAGNOSIS — Z992 Dependence on renal dialysis: Secondary | ICD-10-CM | POA: Diagnosis not present

## 2023-09-27 DIAGNOSIS — Z992 Dependence on renal dialysis: Secondary | ICD-10-CM | POA: Diagnosis not present

## 2023-09-27 DIAGNOSIS — N186 End stage renal disease: Secondary | ICD-10-CM | POA: Diagnosis not present

## 2023-09-29 DIAGNOSIS — Z992 Dependence on renal dialysis: Secondary | ICD-10-CM | POA: Diagnosis not present

## 2023-09-29 DIAGNOSIS — N186 End stage renal disease: Secondary | ICD-10-CM | POA: Diagnosis not present

## 2023-10-02 DIAGNOSIS — N186 End stage renal disease: Secondary | ICD-10-CM | POA: Diagnosis not present

## 2023-10-02 DIAGNOSIS — Z992 Dependence on renal dialysis: Secondary | ICD-10-CM | POA: Diagnosis not present

## 2023-10-04 DIAGNOSIS — Z992 Dependence on renal dialysis: Secondary | ICD-10-CM | POA: Diagnosis not present

## 2023-10-04 DIAGNOSIS — N186 End stage renal disease: Secondary | ICD-10-CM | POA: Diagnosis not present

## 2023-10-06 DIAGNOSIS — N186 End stage renal disease: Secondary | ICD-10-CM | POA: Diagnosis not present

## 2023-10-06 DIAGNOSIS — Z992 Dependence on renal dialysis: Secondary | ICD-10-CM | POA: Diagnosis not present

## 2023-10-09 DIAGNOSIS — N186 End stage renal disease: Secondary | ICD-10-CM | POA: Diagnosis not present

## 2023-10-09 DIAGNOSIS — Z992 Dependence on renal dialysis: Secondary | ICD-10-CM | POA: Diagnosis not present

## 2023-10-11 DIAGNOSIS — N186 End stage renal disease: Secondary | ICD-10-CM | POA: Diagnosis not present

## 2023-10-11 DIAGNOSIS — Z992 Dependence on renal dialysis: Secondary | ICD-10-CM | POA: Diagnosis not present

## 2023-10-13 DIAGNOSIS — N186 End stage renal disease: Secondary | ICD-10-CM | POA: Diagnosis not present

## 2023-10-13 DIAGNOSIS — Z992 Dependence on renal dialysis: Secondary | ICD-10-CM | POA: Diagnosis not present

## 2023-10-16 DIAGNOSIS — Z992 Dependence on renal dialysis: Secondary | ICD-10-CM | POA: Diagnosis not present

## 2023-10-16 DIAGNOSIS — N186 End stage renal disease: Secondary | ICD-10-CM | POA: Diagnosis not present

## 2023-10-18 DIAGNOSIS — N186 End stage renal disease: Secondary | ICD-10-CM | POA: Diagnosis not present

## 2023-10-18 DIAGNOSIS — Z992 Dependence on renal dialysis: Secondary | ICD-10-CM | POA: Diagnosis not present

## 2023-10-20 DIAGNOSIS — N186 End stage renal disease: Secondary | ICD-10-CM | POA: Diagnosis not present

## 2023-10-20 DIAGNOSIS — Z992 Dependence on renal dialysis: Secondary | ICD-10-CM | POA: Diagnosis not present

## 2023-10-21 DIAGNOSIS — Z992 Dependence on renal dialysis: Secondary | ICD-10-CM | POA: Diagnosis not present

## 2023-10-21 DIAGNOSIS — N186 End stage renal disease: Secondary | ICD-10-CM | POA: Diagnosis not present

## 2023-10-23 DIAGNOSIS — Z992 Dependence on renal dialysis: Secondary | ICD-10-CM | POA: Diagnosis not present

## 2023-10-23 DIAGNOSIS — N186 End stage renal disease: Secondary | ICD-10-CM | POA: Diagnosis not present

## 2023-10-25 DIAGNOSIS — N186 End stage renal disease: Secondary | ICD-10-CM | POA: Diagnosis not present

## 2023-10-25 DIAGNOSIS — Z992 Dependence on renal dialysis: Secondary | ICD-10-CM | POA: Diagnosis not present

## 2023-10-27 DIAGNOSIS — N186 End stage renal disease: Secondary | ICD-10-CM | POA: Diagnosis not present

## 2023-10-27 DIAGNOSIS — Z992 Dependence on renal dialysis: Secondary | ICD-10-CM | POA: Diagnosis not present

## 2023-10-30 DIAGNOSIS — Z992 Dependence on renal dialysis: Secondary | ICD-10-CM | POA: Diagnosis not present

## 2023-10-30 DIAGNOSIS — N186 End stage renal disease: Secondary | ICD-10-CM | POA: Diagnosis not present

## 2023-11-01 DIAGNOSIS — N186 End stage renal disease: Secondary | ICD-10-CM | POA: Diagnosis not present

## 2023-11-01 DIAGNOSIS — Z992 Dependence on renal dialysis: Secondary | ICD-10-CM | POA: Diagnosis not present

## 2023-11-03 DIAGNOSIS — N186 End stage renal disease: Secondary | ICD-10-CM | POA: Diagnosis not present

## 2023-11-03 DIAGNOSIS — Z992 Dependence on renal dialysis: Secondary | ICD-10-CM | POA: Diagnosis not present

## 2023-11-06 DIAGNOSIS — N186 End stage renal disease: Secondary | ICD-10-CM | POA: Diagnosis not present

## 2023-11-06 DIAGNOSIS — Z992 Dependence on renal dialysis: Secondary | ICD-10-CM | POA: Diagnosis not present

## 2023-11-08 DIAGNOSIS — Z992 Dependence on renal dialysis: Secondary | ICD-10-CM | POA: Diagnosis not present

## 2023-11-08 DIAGNOSIS — N186 End stage renal disease: Secondary | ICD-10-CM | POA: Diagnosis not present

## 2023-11-10 DIAGNOSIS — N186 End stage renal disease: Secondary | ICD-10-CM | POA: Diagnosis not present

## 2023-11-10 DIAGNOSIS — Z992 Dependence on renal dialysis: Secondary | ICD-10-CM | POA: Diagnosis not present

## 2023-11-13 DIAGNOSIS — Z992 Dependence on renal dialysis: Secondary | ICD-10-CM | POA: Diagnosis not present

## 2023-11-13 DIAGNOSIS — N186 End stage renal disease: Secondary | ICD-10-CM | POA: Diagnosis not present

## 2023-11-16 DIAGNOSIS — Z992 Dependence on renal dialysis: Secondary | ICD-10-CM | POA: Diagnosis not present

## 2023-11-16 DIAGNOSIS — N186 End stage renal disease: Secondary | ICD-10-CM | POA: Diagnosis not present

## 2023-11-17 DIAGNOSIS — N186 End stage renal disease: Secondary | ICD-10-CM | POA: Diagnosis not present

## 2023-11-17 DIAGNOSIS — Z992 Dependence on renal dialysis: Secondary | ICD-10-CM | POA: Diagnosis not present

## 2023-11-20 DIAGNOSIS — Z992 Dependence on renal dialysis: Secondary | ICD-10-CM | POA: Diagnosis not present

## 2023-11-20 DIAGNOSIS — N186 End stage renal disease: Secondary | ICD-10-CM | POA: Diagnosis not present

## 2023-11-21 DIAGNOSIS — N186 End stage renal disease: Secondary | ICD-10-CM | POA: Diagnosis not present

## 2023-11-21 DIAGNOSIS — Z992 Dependence on renal dialysis: Secondary | ICD-10-CM | POA: Diagnosis not present

## 2023-11-22 DIAGNOSIS — Z992 Dependence on renal dialysis: Secondary | ICD-10-CM | POA: Diagnosis not present

## 2023-11-22 DIAGNOSIS — N186 End stage renal disease: Secondary | ICD-10-CM | POA: Diagnosis not present

## 2023-11-24 DIAGNOSIS — N186 End stage renal disease: Secondary | ICD-10-CM | POA: Diagnosis not present

## 2023-11-24 DIAGNOSIS — Z992 Dependence on renal dialysis: Secondary | ICD-10-CM | POA: Diagnosis not present

## 2023-11-27 DIAGNOSIS — N186 End stage renal disease: Secondary | ICD-10-CM | POA: Diagnosis not present

## 2023-11-27 DIAGNOSIS — Z992 Dependence on renal dialysis: Secondary | ICD-10-CM | POA: Diagnosis not present

## 2023-11-29 DIAGNOSIS — N186 End stage renal disease: Secondary | ICD-10-CM | POA: Diagnosis not present

## 2023-11-29 DIAGNOSIS — Z992 Dependence on renal dialysis: Secondary | ICD-10-CM | POA: Diagnosis not present

## 2023-12-01 DIAGNOSIS — Z992 Dependence on renal dialysis: Secondary | ICD-10-CM | POA: Diagnosis not present

## 2023-12-01 DIAGNOSIS — N186 End stage renal disease: Secondary | ICD-10-CM | POA: Diagnosis not present

## 2023-12-04 DIAGNOSIS — Z992 Dependence on renal dialysis: Secondary | ICD-10-CM | POA: Diagnosis not present

## 2023-12-04 DIAGNOSIS — N186 End stage renal disease: Secondary | ICD-10-CM | POA: Diagnosis not present

## 2023-12-06 DIAGNOSIS — Z992 Dependence on renal dialysis: Secondary | ICD-10-CM | POA: Diagnosis not present

## 2023-12-06 DIAGNOSIS — N186 End stage renal disease: Secondary | ICD-10-CM | POA: Diagnosis not present

## 2023-12-08 DIAGNOSIS — Z992 Dependence on renal dialysis: Secondary | ICD-10-CM | POA: Diagnosis not present

## 2023-12-08 DIAGNOSIS — N186 End stage renal disease: Secondary | ICD-10-CM | POA: Diagnosis not present

## 2023-12-10 DIAGNOSIS — N186 End stage renal disease: Secondary | ICD-10-CM | POA: Diagnosis not present

## 2023-12-10 DIAGNOSIS — Z992 Dependence on renal dialysis: Secondary | ICD-10-CM | POA: Diagnosis not present

## 2023-12-11 DIAGNOSIS — N186 End stage renal disease: Secondary | ICD-10-CM | POA: Diagnosis not present

## 2023-12-11 DIAGNOSIS — Z992 Dependence on renal dialysis: Secondary | ICD-10-CM | POA: Diagnosis not present

## 2023-12-13 DIAGNOSIS — N186 End stage renal disease: Secondary | ICD-10-CM | POA: Diagnosis not present

## 2023-12-13 DIAGNOSIS — Z992 Dependence on renal dialysis: Secondary | ICD-10-CM | POA: Diagnosis not present

## 2023-12-15 DIAGNOSIS — Z992 Dependence on renal dialysis: Secondary | ICD-10-CM | POA: Diagnosis not present

## 2023-12-15 DIAGNOSIS — N186 End stage renal disease: Secondary | ICD-10-CM | POA: Diagnosis not present

## 2023-12-20 DIAGNOSIS — N186 End stage renal disease: Secondary | ICD-10-CM | POA: Diagnosis not present

## 2023-12-20 DIAGNOSIS — Z992 Dependence on renal dialysis: Secondary | ICD-10-CM | POA: Diagnosis not present

## 2023-12-22 DIAGNOSIS — Z992 Dependence on renal dialysis: Secondary | ICD-10-CM | POA: Diagnosis not present

## 2023-12-22 DIAGNOSIS — N186 End stage renal disease: Secondary | ICD-10-CM | POA: Diagnosis not present

## 2023-12-23 DIAGNOSIS — Z992 Dependence on renal dialysis: Secondary | ICD-10-CM | POA: Diagnosis not present

## 2023-12-23 DIAGNOSIS — N186 End stage renal disease: Secondary | ICD-10-CM | POA: Diagnosis not present

## 2023-12-25 DIAGNOSIS — N186 End stage renal disease: Secondary | ICD-10-CM | POA: Diagnosis not present

## 2023-12-25 DIAGNOSIS — Z992 Dependence on renal dialysis: Secondary | ICD-10-CM | POA: Diagnosis not present

## 2023-12-27 DIAGNOSIS — N186 End stage renal disease: Secondary | ICD-10-CM | POA: Diagnosis not present

## 2023-12-27 DIAGNOSIS — Z992 Dependence on renal dialysis: Secondary | ICD-10-CM | POA: Diagnosis not present

## 2023-12-29 DIAGNOSIS — N186 End stage renal disease: Secondary | ICD-10-CM | POA: Diagnosis not present

## 2023-12-29 DIAGNOSIS — Z992 Dependence on renal dialysis: Secondary | ICD-10-CM | POA: Diagnosis not present

## 2024-01-01 DIAGNOSIS — Z992 Dependence on renal dialysis: Secondary | ICD-10-CM | POA: Diagnosis not present

## 2024-01-01 DIAGNOSIS — N186 End stage renal disease: Secondary | ICD-10-CM | POA: Diagnosis not present

## 2024-01-03 DIAGNOSIS — Z992 Dependence on renal dialysis: Secondary | ICD-10-CM | POA: Diagnosis not present

## 2024-01-03 DIAGNOSIS — N186 End stage renal disease: Secondary | ICD-10-CM | POA: Diagnosis not present

## 2024-01-05 DIAGNOSIS — N186 End stage renal disease: Secondary | ICD-10-CM | POA: Diagnosis not present

## 2024-01-05 DIAGNOSIS — Z992 Dependence on renal dialysis: Secondary | ICD-10-CM | POA: Diagnosis not present

## 2024-01-08 DIAGNOSIS — Z992 Dependence on renal dialysis: Secondary | ICD-10-CM | POA: Diagnosis not present

## 2024-01-08 DIAGNOSIS — N186 End stage renal disease: Secondary | ICD-10-CM | POA: Diagnosis not present

## 2024-01-10 DIAGNOSIS — N186 End stage renal disease: Secondary | ICD-10-CM | POA: Diagnosis not present

## 2024-01-10 DIAGNOSIS — Z992 Dependence on renal dialysis: Secondary | ICD-10-CM | POA: Diagnosis not present

## 2024-01-12 DIAGNOSIS — N186 End stage renal disease: Secondary | ICD-10-CM | POA: Diagnosis not present

## 2024-01-12 DIAGNOSIS — Z992 Dependence on renal dialysis: Secondary | ICD-10-CM | POA: Diagnosis not present

## 2024-01-15 DIAGNOSIS — Z992 Dependence on renal dialysis: Secondary | ICD-10-CM | POA: Diagnosis not present

## 2024-01-15 DIAGNOSIS — N186 End stage renal disease: Secondary | ICD-10-CM | POA: Diagnosis not present

## 2024-01-17 DIAGNOSIS — N186 End stage renal disease: Secondary | ICD-10-CM | POA: Diagnosis not present

## 2024-01-17 DIAGNOSIS — Z992 Dependence on renal dialysis: Secondary | ICD-10-CM | POA: Diagnosis not present

## 2024-01-19 DIAGNOSIS — Z992 Dependence on renal dialysis: Secondary | ICD-10-CM | POA: Diagnosis not present

## 2024-01-19 DIAGNOSIS — N186 End stage renal disease: Secondary | ICD-10-CM | POA: Diagnosis not present

## 2024-01-22 DIAGNOSIS — Z992 Dependence on renal dialysis: Secondary | ICD-10-CM | POA: Diagnosis not present

## 2024-01-22 DIAGNOSIS — N186 End stage renal disease: Secondary | ICD-10-CM | POA: Diagnosis not present

## 2024-01-23 ENCOUNTER — Telehealth: Payer: Self-pay

## 2024-01-23 DIAGNOSIS — E6609 Other obesity due to excess calories: Secondary | ICD-10-CM | POA: Diagnosis not present

## 2024-01-23 DIAGNOSIS — E782 Mixed hyperlipidemia: Secondary | ICD-10-CM | POA: Diagnosis not present

## 2024-01-23 DIAGNOSIS — N184 Chronic kidney disease, stage 4 (severe): Secondary | ICD-10-CM | POA: Diagnosis not present

## 2024-01-23 DIAGNOSIS — E1129 Type 2 diabetes mellitus with other diabetic kidney complication: Secondary | ICD-10-CM | POA: Diagnosis not present

## 2024-01-23 DIAGNOSIS — Z6833 Body mass index (BMI) 33.0-33.9, adult: Secondary | ICD-10-CM | POA: Diagnosis not present

## 2024-01-23 DIAGNOSIS — E7849 Other hyperlipidemia: Secondary | ICD-10-CM | POA: Diagnosis not present

## 2024-01-23 DIAGNOSIS — I10 Essential (primary) hypertension: Secondary | ICD-10-CM | POA: Diagnosis not present

## 2024-01-23 DIAGNOSIS — M159 Polyosteoarthritis, unspecified: Secondary | ICD-10-CM | POA: Diagnosis not present

## 2024-01-23 DIAGNOSIS — Z1331 Encounter for screening for depression: Secondary | ICD-10-CM | POA: Diagnosis not present

## 2024-01-23 DIAGNOSIS — Z0001 Encounter for general adult medical examination with abnormal findings: Secondary | ICD-10-CM | POA: Diagnosis not present

## 2024-01-23 DIAGNOSIS — Z992 Dependence on renal dialysis: Secondary | ICD-10-CM | POA: Diagnosis not present

## 2024-01-23 NOTE — Telephone Encounter (Signed)
 Spoke to pt to see if she was still needing fistulogram, as she was going to reach out to schedule this when she was ready. Pt states she as far as she knows her fistula is working well and without issue. No questions/concerns at this time.

## 2024-01-24 DIAGNOSIS — Z992 Dependence on renal dialysis: Secondary | ICD-10-CM | POA: Diagnosis not present

## 2024-01-24 DIAGNOSIS — N186 End stage renal disease: Secondary | ICD-10-CM | POA: Diagnosis not present

## 2024-01-26 DIAGNOSIS — N186 End stage renal disease: Secondary | ICD-10-CM | POA: Diagnosis not present

## 2024-01-26 DIAGNOSIS — Z992 Dependence on renal dialysis: Secondary | ICD-10-CM | POA: Diagnosis not present

## 2024-01-29 ENCOUNTER — Other Ambulatory Visit: Payer: Self-pay | Admitting: Family Medicine

## 2024-01-29 DIAGNOSIS — Z1231 Encounter for screening mammogram for malignant neoplasm of breast: Secondary | ICD-10-CM

## 2024-01-29 DIAGNOSIS — N186 End stage renal disease: Secondary | ICD-10-CM | POA: Diagnosis not present

## 2024-01-29 DIAGNOSIS — Z992 Dependence on renal dialysis: Secondary | ICD-10-CM | POA: Diagnosis not present

## 2024-01-31 DIAGNOSIS — N186 End stage renal disease: Secondary | ICD-10-CM | POA: Diagnosis not present

## 2024-01-31 DIAGNOSIS — Z992 Dependence on renal dialysis: Secondary | ICD-10-CM | POA: Diagnosis not present

## 2024-02-02 ENCOUNTER — Ambulatory Visit
Admission: RE | Admit: 2024-02-02 | Discharge: 2024-02-02 | Disposition: A | Discharge status: Home / Self Care | Source: Ambulatory Visit | Attending: Family Medicine | Admitting: Family Medicine

## 2024-02-02 DIAGNOSIS — N186 End stage renal disease: Secondary | ICD-10-CM | POA: Diagnosis not present

## 2024-02-02 DIAGNOSIS — Z1231 Encounter for screening mammogram for malignant neoplasm of breast: Secondary | ICD-10-CM | POA: Diagnosis not present

## 2024-02-02 DIAGNOSIS — Z992 Dependence on renal dialysis: Secondary | ICD-10-CM | POA: Diagnosis not present

## 2024-02-05 DIAGNOSIS — N186 End stage renal disease: Secondary | ICD-10-CM | POA: Diagnosis not present

## 2024-02-05 DIAGNOSIS — Z992 Dependence on renal dialysis: Secondary | ICD-10-CM | POA: Diagnosis not present

## 2024-02-07 ENCOUNTER — Other Ambulatory Visit: Payer: Self-pay | Admitting: Family Medicine

## 2024-02-07 DIAGNOSIS — N186 End stage renal disease: Secondary | ICD-10-CM | POA: Diagnosis not present

## 2024-02-07 DIAGNOSIS — R928 Other abnormal and inconclusive findings on diagnostic imaging of breast: Secondary | ICD-10-CM

## 2024-02-07 DIAGNOSIS — Z992 Dependence on renal dialysis: Secondary | ICD-10-CM | POA: Diagnosis not present

## 2024-02-09 DIAGNOSIS — Z992 Dependence on renal dialysis: Secondary | ICD-10-CM | POA: Diagnosis not present

## 2024-02-09 DIAGNOSIS — N186 End stage renal disease: Secondary | ICD-10-CM | POA: Diagnosis not present

## 2024-02-12 DIAGNOSIS — Z992 Dependence on renal dialysis: Secondary | ICD-10-CM | POA: Diagnosis not present

## 2024-02-12 DIAGNOSIS — N186 End stage renal disease: Secondary | ICD-10-CM | POA: Diagnosis not present

## 2024-02-14 DIAGNOSIS — Z992 Dependence on renal dialysis: Secondary | ICD-10-CM | POA: Diagnosis not present

## 2024-02-14 DIAGNOSIS — N186 End stage renal disease: Secondary | ICD-10-CM | POA: Diagnosis not present

## 2024-02-16 DIAGNOSIS — Z992 Dependence on renal dialysis: Secondary | ICD-10-CM | POA: Diagnosis not present

## 2024-02-16 DIAGNOSIS — N186 End stage renal disease: Secondary | ICD-10-CM | POA: Diagnosis not present

## 2024-02-19 DIAGNOSIS — N186 End stage renal disease: Secondary | ICD-10-CM | POA: Diagnosis not present

## 2024-02-19 DIAGNOSIS — Z992 Dependence on renal dialysis: Secondary | ICD-10-CM | POA: Diagnosis not present

## 2024-02-20 ENCOUNTER — Encounter

## 2024-02-20 ENCOUNTER — Other Ambulatory Visit

## 2024-02-21 DIAGNOSIS — N186 End stage renal disease: Secondary | ICD-10-CM | POA: Diagnosis not present

## 2024-02-21 DIAGNOSIS — Z992 Dependence on renal dialysis: Secondary | ICD-10-CM | POA: Diagnosis not present

## 2024-02-23 DIAGNOSIS — Z992 Dependence on renal dialysis: Secondary | ICD-10-CM | POA: Diagnosis not present

## 2024-02-23 DIAGNOSIS — N186 End stage renal disease: Secondary | ICD-10-CM | POA: Diagnosis not present

## 2024-02-26 DIAGNOSIS — Z992 Dependence on renal dialysis: Secondary | ICD-10-CM | POA: Diagnosis not present

## 2024-02-26 DIAGNOSIS — N186 End stage renal disease: Secondary | ICD-10-CM | POA: Diagnosis not present

## 2024-02-28 DIAGNOSIS — Z992 Dependence on renal dialysis: Secondary | ICD-10-CM | POA: Diagnosis not present

## 2024-02-28 DIAGNOSIS — N186 End stage renal disease: Secondary | ICD-10-CM | POA: Diagnosis not present

## 2024-03-01 DIAGNOSIS — Z992 Dependence on renal dialysis: Secondary | ICD-10-CM | POA: Diagnosis not present

## 2024-03-01 DIAGNOSIS — N186 End stage renal disease: Secondary | ICD-10-CM | POA: Diagnosis not present

## 2024-03-04 DIAGNOSIS — Z992 Dependence on renal dialysis: Secondary | ICD-10-CM | POA: Diagnosis not present

## 2024-03-04 DIAGNOSIS — N186 End stage renal disease: Secondary | ICD-10-CM | POA: Diagnosis not present

## 2024-03-06 DIAGNOSIS — Z992 Dependence on renal dialysis: Secondary | ICD-10-CM | POA: Diagnosis not present

## 2024-03-06 DIAGNOSIS — N186 End stage renal disease: Secondary | ICD-10-CM | POA: Diagnosis not present

## 2024-03-08 DIAGNOSIS — Z992 Dependence on renal dialysis: Secondary | ICD-10-CM | POA: Diagnosis not present

## 2024-03-08 DIAGNOSIS — N186 End stage renal disease: Secondary | ICD-10-CM | POA: Diagnosis not present

## 2024-03-11 DIAGNOSIS — N186 End stage renal disease: Secondary | ICD-10-CM | POA: Diagnosis not present

## 2024-03-11 DIAGNOSIS — Z992 Dependence on renal dialysis: Secondary | ICD-10-CM | POA: Diagnosis not present

## 2024-03-13 DIAGNOSIS — Z992 Dependence on renal dialysis: Secondary | ICD-10-CM | POA: Diagnosis not present

## 2024-03-13 DIAGNOSIS — N186 End stage renal disease: Secondary | ICD-10-CM | POA: Diagnosis not present

## 2024-03-18 DIAGNOSIS — Z992 Dependence on renal dialysis: Secondary | ICD-10-CM | POA: Diagnosis not present

## 2024-03-18 DIAGNOSIS — N186 End stage renal disease: Secondary | ICD-10-CM | POA: Diagnosis not present

## 2024-03-20 DIAGNOSIS — N186 End stage renal disease: Secondary | ICD-10-CM | POA: Diagnosis not present

## 2024-03-20 DIAGNOSIS — Z992 Dependence on renal dialysis: Secondary | ICD-10-CM | POA: Diagnosis not present

## 2024-03-22 DIAGNOSIS — N186 End stage renal disease: Secondary | ICD-10-CM | POA: Diagnosis not present

## 2024-03-22 DIAGNOSIS — Z992 Dependence on renal dialysis: Secondary | ICD-10-CM | POA: Diagnosis not present

## 2024-03-25 DIAGNOSIS — Z992 Dependence on renal dialysis: Secondary | ICD-10-CM | POA: Diagnosis not present

## 2024-03-25 DIAGNOSIS — N186 End stage renal disease: Secondary | ICD-10-CM | POA: Diagnosis not present

## 2024-03-27 DIAGNOSIS — N186 End stage renal disease: Secondary | ICD-10-CM | POA: Diagnosis not present

## 2024-03-27 DIAGNOSIS — Z992 Dependence on renal dialysis: Secondary | ICD-10-CM | POA: Diagnosis not present

## 2024-03-29 DIAGNOSIS — Z992 Dependence on renal dialysis: Secondary | ICD-10-CM | POA: Diagnosis not present

## 2024-03-29 DIAGNOSIS — N186 End stage renal disease: Secondary | ICD-10-CM | POA: Diagnosis not present

## 2024-04-01 DIAGNOSIS — Z992 Dependence on renal dialysis: Secondary | ICD-10-CM | POA: Diagnosis not present

## 2024-04-01 DIAGNOSIS — N186 End stage renal disease: Secondary | ICD-10-CM | POA: Diagnosis not present

## 2024-04-03 DIAGNOSIS — N186 End stage renal disease: Secondary | ICD-10-CM | POA: Diagnosis not present

## 2024-04-03 DIAGNOSIS — Z992 Dependence on renal dialysis: Secondary | ICD-10-CM | POA: Diagnosis not present

## 2024-04-05 DIAGNOSIS — Z992 Dependence on renal dialysis: Secondary | ICD-10-CM | POA: Diagnosis not present

## 2024-04-05 DIAGNOSIS — N186 End stage renal disease: Secondary | ICD-10-CM | POA: Diagnosis not present

## 2024-04-08 DIAGNOSIS — Z992 Dependence on renal dialysis: Secondary | ICD-10-CM | POA: Diagnosis not present

## 2024-04-08 DIAGNOSIS — N186 End stage renal disease: Secondary | ICD-10-CM | POA: Diagnosis not present

## 2024-04-08 DIAGNOSIS — E1122 Type 2 diabetes mellitus with diabetic chronic kidney disease: Secondary | ICD-10-CM | POA: Diagnosis not present

## 2024-04-10 DIAGNOSIS — Z992 Dependence on renal dialysis: Secondary | ICD-10-CM | POA: Diagnosis not present

## 2024-04-10 DIAGNOSIS — N186 End stage renal disease: Secondary | ICD-10-CM | POA: Diagnosis not present

## 2024-04-12 DIAGNOSIS — Z992 Dependence on renal dialysis: Secondary | ICD-10-CM | POA: Diagnosis not present

## 2024-04-12 DIAGNOSIS — N186 End stage renal disease: Secondary | ICD-10-CM | POA: Diagnosis not present

## 2024-04-15 DIAGNOSIS — N186 End stage renal disease: Secondary | ICD-10-CM | POA: Diagnosis not present

## 2024-04-15 DIAGNOSIS — Z992 Dependence on renal dialysis: Secondary | ICD-10-CM | POA: Diagnosis not present

## 2024-04-17 DIAGNOSIS — Z992 Dependence on renal dialysis: Secondary | ICD-10-CM | POA: Diagnosis not present

## 2024-04-17 DIAGNOSIS — N186 End stage renal disease: Secondary | ICD-10-CM | POA: Diagnosis not present

## 2024-04-19 DIAGNOSIS — Z992 Dependence on renal dialysis: Secondary | ICD-10-CM | POA: Diagnosis not present

## 2024-04-19 DIAGNOSIS — N186 End stage renal disease: Secondary | ICD-10-CM | POA: Diagnosis not present

## 2024-04-20 DIAGNOSIS — Z992 Dependence on renal dialysis: Secondary | ICD-10-CM | POA: Diagnosis not present

## 2024-04-20 DIAGNOSIS — N186 End stage renal disease: Secondary | ICD-10-CM | POA: Diagnosis not present

## 2024-04-22 DIAGNOSIS — N186 End stage renal disease: Secondary | ICD-10-CM | POA: Diagnosis not present

## 2024-04-22 DIAGNOSIS — Z992 Dependence on renal dialysis: Secondary | ICD-10-CM | POA: Diagnosis not present

## 2024-04-24 DIAGNOSIS — Z992 Dependence on renal dialysis: Secondary | ICD-10-CM | POA: Diagnosis not present

## 2024-04-24 DIAGNOSIS — N186 End stage renal disease: Secondary | ICD-10-CM | POA: Diagnosis not present

## 2024-04-26 DIAGNOSIS — Z992 Dependence on renal dialysis: Secondary | ICD-10-CM | POA: Diagnosis not present

## 2024-04-26 DIAGNOSIS — N186 End stage renal disease: Secondary | ICD-10-CM | POA: Diagnosis not present

## 2024-04-29 DIAGNOSIS — N186 End stage renal disease: Secondary | ICD-10-CM | POA: Diagnosis not present

## 2024-04-29 DIAGNOSIS — Z992 Dependence on renal dialysis: Secondary | ICD-10-CM | POA: Diagnosis not present

## 2024-05-01 DIAGNOSIS — Z992 Dependence on renal dialysis: Secondary | ICD-10-CM | POA: Diagnosis not present

## 2024-05-01 DIAGNOSIS — N186 End stage renal disease: Secondary | ICD-10-CM | POA: Diagnosis not present

## 2024-05-03 DIAGNOSIS — Z992 Dependence on renal dialysis: Secondary | ICD-10-CM | POA: Diagnosis not present

## 2024-05-03 DIAGNOSIS — N186 End stage renal disease: Secondary | ICD-10-CM | POA: Diagnosis not present

## 2024-05-06 DIAGNOSIS — Z992 Dependence on renal dialysis: Secondary | ICD-10-CM | POA: Diagnosis not present

## 2024-05-06 DIAGNOSIS — N186 End stage renal disease: Secondary | ICD-10-CM | POA: Diagnosis not present

## 2024-05-08 DIAGNOSIS — Z992 Dependence on renal dialysis: Secondary | ICD-10-CM | POA: Diagnosis not present

## 2024-05-08 DIAGNOSIS — N186 End stage renal disease: Secondary | ICD-10-CM | POA: Diagnosis not present

## 2024-05-10 DIAGNOSIS — N186 End stage renal disease: Secondary | ICD-10-CM | POA: Diagnosis not present

## 2024-05-10 DIAGNOSIS — Z992 Dependence on renal dialysis: Secondary | ICD-10-CM | POA: Diagnosis not present

## 2024-05-13 DIAGNOSIS — Z992 Dependence on renal dialysis: Secondary | ICD-10-CM | POA: Diagnosis not present

## 2024-05-13 DIAGNOSIS — N186 End stage renal disease: Secondary | ICD-10-CM | POA: Diagnosis not present

## 2024-05-15 DIAGNOSIS — Z992 Dependence on renal dialysis: Secondary | ICD-10-CM | POA: Diagnosis not present

## 2024-05-15 DIAGNOSIS — N186 End stage renal disease: Secondary | ICD-10-CM | POA: Diagnosis not present

## 2024-05-17 DIAGNOSIS — Z992 Dependence on renal dialysis: Secondary | ICD-10-CM | POA: Diagnosis not present

## 2024-05-17 DIAGNOSIS — N186 End stage renal disease: Secondary | ICD-10-CM | POA: Diagnosis not present

## 2024-05-20 DIAGNOSIS — Z992 Dependence on renal dialysis: Secondary | ICD-10-CM | POA: Diagnosis not present

## 2024-05-20 DIAGNOSIS — N186 End stage renal disease: Secondary | ICD-10-CM | POA: Diagnosis not present

## 2024-05-22 DIAGNOSIS — Z992 Dependence on renal dialysis: Secondary | ICD-10-CM | POA: Diagnosis not present

## 2024-05-22 DIAGNOSIS — N186 End stage renal disease: Secondary | ICD-10-CM | POA: Diagnosis not present

## 2024-05-24 DIAGNOSIS — Z992 Dependence on renal dialysis: Secondary | ICD-10-CM | POA: Diagnosis not present

## 2024-05-24 DIAGNOSIS — N186 End stage renal disease: Secondary | ICD-10-CM | POA: Diagnosis not present

## 2024-05-27 DIAGNOSIS — N186 End stage renal disease: Secondary | ICD-10-CM | POA: Diagnosis not present

## 2024-05-27 DIAGNOSIS — Z992 Dependence on renal dialysis: Secondary | ICD-10-CM | POA: Diagnosis not present

## 2024-05-29 DIAGNOSIS — Z992 Dependence on renal dialysis: Secondary | ICD-10-CM | POA: Diagnosis not present

## 2024-05-29 DIAGNOSIS — N186 End stage renal disease: Secondary | ICD-10-CM | POA: Diagnosis not present

## 2024-05-30 ENCOUNTER — Ambulatory Visit
Admission: RE | Admit: 2024-05-30 | Discharge: 2024-05-30 | Disposition: A | Discharge status: Home / Self Care | Source: Ambulatory Visit | Attending: Family Medicine | Admitting: Family Medicine

## 2024-05-30 DIAGNOSIS — R928 Other abnormal and inconclusive findings on diagnostic imaging of breast: Secondary | ICD-10-CM

## 2024-05-30 DIAGNOSIS — N6042 Mammary duct ectasia of left breast: Secondary | ICD-10-CM | POA: Diagnosis not present

## 2024-05-31 DIAGNOSIS — N186 End stage renal disease: Secondary | ICD-10-CM | POA: Diagnosis not present

## 2024-05-31 DIAGNOSIS — Z992 Dependence on renal dialysis: Secondary | ICD-10-CM | POA: Diagnosis not present

## 2024-06-03 DIAGNOSIS — N186 End stage renal disease: Secondary | ICD-10-CM | POA: Diagnosis not present

## 2024-06-03 DIAGNOSIS — Z992 Dependence on renal dialysis: Secondary | ICD-10-CM | POA: Diagnosis not present

## 2024-06-05 DIAGNOSIS — Z992 Dependence on renal dialysis: Secondary | ICD-10-CM | POA: Diagnosis not present

## 2024-06-05 DIAGNOSIS — N186 End stage renal disease: Secondary | ICD-10-CM | POA: Diagnosis not present

## 2024-06-07 DIAGNOSIS — N186 End stage renal disease: Secondary | ICD-10-CM | POA: Diagnosis not present

## 2024-06-07 DIAGNOSIS — Z992 Dependence on renal dialysis: Secondary | ICD-10-CM | POA: Diagnosis not present

## 2024-06-10 DIAGNOSIS — Z992 Dependence on renal dialysis: Secondary | ICD-10-CM | POA: Diagnosis not present

## 2024-06-10 DIAGNOSIS — N186 End stage renal disease: Secondary | ICD-10-CM | POA: Diagnosis not present

## 2024-06-12 DIAGNOSIS — E6609 Other obesity due to excess calories: Secondary | ICD-10-CM | POA: Diagnosis not present

## 2024-06-12 DIAGNOSIS — Z6833 Body mass index (BMI) 33.0-33.9, adult: Secondary | ICD-10-CM | POA: Diagnosis not present

## 2024-06-12 DIAGNOSIS — E538 Deficiency of other specified B group vitamins: Secondary | ICD-10-CM | POA: Diagnosis not present

## 2024-06-12 DIAGNOSIS — Z992 Dependence on renal dialysis: Secondary | ICD-10-CM | POA: Diagnosis not present

## 2024-06-12 DIAGNOSIS — R5383 Other fatigue: Secondary | ICD-10-CM | POA: Diagnosis not present

## 2024-06-12 DIAGNOSIS — N186 End stage renal disease: Secondary | ICD-10-CM | POA: Diagnosis not present

## 2024-06-15 DIAGNOSIS — Z992 Dependence on renal dialysis: Secondary | ICD-10-CM | POA: Diagnosis not present

## 2024-06-15 DIAGNOSIS — N186 End stage renal disease: Secondary | ICD-10-CM | POA: Diagnosis not present

## 2024-06-16 ENCOUNTER — Emergency Department (HOSPITAL_COMMUNITY)

## 2024-06-16 ENCOUNTER — Encounter (HOSPITAL_COMMUNITY): Payer: Self-pay

## 2024-06-16 ENCOUNTER — Emergency Department (HOSPITAL_COMMUNITY): Admission: EM | Admit: 2024-06-16 | Discharge: 2024-06-16 | Disposition: A | Discharge status: Home / Self Care

## 2024-06-16 DIAGNOSIS — Z7902 Long term (current) use of antithrombotics/antiplatelets: Secondary | ICD-10-CM | POA: Insufficient documentation

## 2024-06-16 DIAGNOSIS — K8020 Calculus of gallbladder without cholecystitis without obstruction: Secondary | ICD-10-CM | POA: Diagnosis not present

## 2024-06-16 DIAGNOSIS — R112 Nausea with vomiting, unspecified: Secondary | ICD-10-CM

## 2024-06-16 DIAGNOSIS — R1011 Right upper quadrant pain: Secondary | ICD-10-CM | POA: Diagnosis present

## 2024-06-16 LAB — CBC WITH DIFFERENTIAL/PLATELET
Abs Immature Granulocytes: 0.03 K/uL (ref 0.00–0.07)
Basophils Absolute: 0 K/uL (ref 0.0–0.1)
Basophils Relative: 1 %
Eosinophils Absolute: 0.2 K/uL (ref 0.0–0.5)
Eosinophils Relative: 4 %
HCT: 28.9 % — ABNORMAL LOW (ref 36.0–46.0)
Hemoglobin: 9 g/dL — ABNORMAL LOW (ref 12.0–15.0)
Immature Granulocytes: 1 %
Lymphocytes Relative: 14 %
Lymphs Abs: 0.9 K/uL (ref 0.7–4.0)
MCH: 27.9 pg (ref 26.0–34.0)
MCHC: 31.1 g/dL (ref 30.0–36.0)
MCV: 89.5 fL (ref 80.0–100.0)
Monocytes Absolute: 0.8 K/uL (ref 0.1–1.0)
Monocytes Relative: 13 %
Neutro Abs: 4.4 K/uL (ref 1.7–7.7)
Neutrophils Relative %: 67 %
Platelets: 255 K/uL (ref 150–400)
RBC: 3.23 MIL/uL — ABNORMAL LOW (ref 3.87–5.11)
RDW: 15.6 % — ABNORMAL HIGH (ref 11.5–15.5)
WBC: 6.4 K/uL (ref 4.0–10.5)
nRBC: 0 % (ref 0.0–0.2)

## 2024-06-16 LAB — BASIC METABOLIC PANEL WITH GFR
Anion gap: 14 (ref 5–15)
BUN: 25 mg/dL — ABNORMAL HIGH (ref 8–23)
CO2: 25 mmol/L (ref 22–32)
Calcium: 9.9 mg/dL (ref 8.9–10.3)
Chloride: 97 mmol/L — ABNORMAL LOW (ref 98–111)
Creatinine, Ser: 8.46 mg/dL — ABNORMAL HIGH (ref 0.44–1.00)
GFR, Estimated: 5 mL/min — ABNORMAL LOW (ref >60)
Glucose, Bld: 97 mg/dL (ref 70–99)
Potassium: 4 mmol/L (ref 3.5–5.1)
Sodium: 136 mmol/L (ref 135–145)

## 2024-06-16 LAB — HEPATIC FUNCTION PANEL
ALT: 10 U/L (ref 0–44)
AST: 18 U/L (ref 15–41)
Albumin: 3.5 g/dL (ref 3.5–5.0)
Alkaline Phosphatase: 41 U/L (ref 38–126)
Bilirubin, Direct: 0.3 mg/dL — ABNORMAL HIGH (ref 0.0–0.2)
Indirect Bilirubin: 0.8 mg/dL (ref 0.3–0.9)
Total Bilirubin: 1.1 mg/dL (ref 0.0–1.2)
Total Protein: 8.1 g/dL (ref 6.5–8.1)

## 2024-06-16 LAB — LIPASE, BLOOD: Lipase: 33 U/L (ref 11–51)

## 2024-06-16 MED ORDER — SODIUM CHLORIDE 0.9 % IV BOLUS
500.0000 mL | Freq: Once | INTRAVENOUS | Status: AC
  Administered 2024-06-16: 500 mL via INTRAVENOUS

## 2024-06-16 MED ORDER — IOHEXOL 300 MG/ML  SOLN
100.0000 mL | Freq: Once | INTRAMUSCULAR | Status: AC | PRN
  Administered 2024-06-16: 100 mL via INTRAVENOUS

## 2024-06-16 MED ORDER — FENTANYL CITRATE (PF) 100 MCG/2ML IJ SOLN
25.0000 ug | Freq: Once | INTRAMUSCULAR | Status: DC
  Filled 2024-06-16: qty 2

## 2024-06-16 MED ORDER — ONDANSETRON HCL 4 MG PO TABS: 4.0000 mg | ORAL_TABLET | Freq: Three times a day (TID) | ORAL | 0 refills | Status: AC | PRN

## 2024-06-16 MED ORDER — ONDANSETRON HCL 4 MG/2ML IJ SOLN
4.0000 mg | Freq: Once | INTRAMUSCULAR | Status: AC
  Administered 2024-06-16: 4 mg via INTRAVENOUS
  Filled 2024-06-16: qty 2

## 2024-06-16 MED ORDER — METHOCARBAMOL 500 MG PO TABS: 500.0000 mg | ORAL_TABLET | Freq: Three times a day (TID) | ORAL | 0 refills | Status: AC | PRN

## 2024-06-16 NOTE — ED Provider Notes (Signed)
 [place] EMERGENCY DEPARTMENT AT [facility] Provider Note   CSN: [id number] Arrival date & time: 06/16/24  1737     Patient presents with: Flank Pain   Angel Davis is a 73 y.o. female.  {Add pertinent medical, surgical, social history, OB history to HPI:2174} 73 year old female presents for evaluation of right upper quadrant abdominal pain.  States that been going for 3 days and does not seem to get worse when she eats.  States its comes and goes and radiates to her right flank area.  She admits to some occasional vomiting and nausea as well.  Denies any diarrhea, urinary symptoms, or any other symptoms or concerns at this time.   Flank Pain Associated symptoms include abdominal pain. Pertinent negatives include no chest pain and no shortness of breath.       Prior to Admission medications   Medication Sig Start Date End Date Taking? Authorizing Provider  acetaminophen (TYLENOL) 500 MG tablet Take 500-1,000 mg by mouth every 6 (six) hours as needed for moderate pain.    [provider]  calcitRIOL (ROCALTROL) 0.25 MCG capsule Take 1.25 mcg by mouth. Mon, Wed, Fri    [provider]  calcium elemental as carbonate (TUMS ULTRA 1000) 400 MG chewable tablet Chew 1,000 mg by mouth 3 (three) times daily before meals.    [provider]  carvedilol (COREG) 25 MG tablet Take 25 mg by mouth daily at 6 (six) AM.    [provider]  cinacalcet (SENSIPAR) 30 MG tablet Take 30 mg by mouth every Monday, Wednesday, and Friday.    [provider]  clopidogrel (PLAVIX) 75 MG tablet Take 75 mg by mouth daily.    [provider]  Epoetin  Alfa (EPOGEN  IJ) Inject as directed. 5000 units Mon, Wed, Fri    [provider]  escitalopram (LEXAPRO) 10 MG tablet Take 10 mg by mouth daily. 11/16/20   [provider]  NIFEdipine  (PROCARDIA  XL/ADALAT -CC) 90 MG 24 hr tablet Take 1 tablet (90 mg total) by mouth daily. 08/12/15    Antonetta Rollene BRAVO, MD  UNABLE TO FIND Med Name: Venofer  50mg  once a week at dialysis    [provider]    Allergies: Patient has no known allergies.    Review of Systems  Constitutional:  Negative for chills and fever.  HENT:  Negative for ear pain and sore throat.   Eyes:  Negative for pain and visual disturbance.  Respiratory:  Negative for cough and shortness of breath.   Cardiovascular:  Negative for chest pain and palpitations.  Gastrointestinal:  Positive for abdominal pain, nausea and vomiting.  Genitourinary:  Positive for flank pain. Negative for dysuria and hematuria.  Musculoskeletal:  Negative for arthralgias and back pain.  Skin:  Negative for color change and rash.  Neurological:  Negative for seizures and syncope.  All other systems reviewed and are negative.   Updated Vital Signs BP (!) 152/69 (BP Location: Right Arm)   Pulse 97   Temp 98.1 F (36.7 C) (Oral)   Resp 18   Ht 5' 4 (1.626 m)   Wt 86.2 kg   SpO2 91%   BMI 32.61 kg/m   Physical Exam Vitals and nursing note reviewed.  Constitutional:      General: She is not in acute distress.    Appearance: Normal appearance. She is well-developed. She is not ill-appearing.  HENT:     Head: Normocephalic and atraumatic.  Eyes:     Conjunctiva/sclera:  Conjunctivae normal.  Cardiovascular:     Rate and Rhythm: Normal rate and regular rhythm.     Heart sounds: Normal heart sounds. No murmur heard. Pulmonary:     Effort: Pulmonary effort is normal. No respiratory distress.     Breath sounds: Normal breath sounds. No wheezing.  Abdominal:     General: Abdomen is flat. There is no distension.     Palpations: Abdomen is soft. There is no mass.     Tenderness: There is no abdominal tenderness.     Hernia: No hernia is present.  Musculoskeletal:        General: No swelling.     Cervical back: Neck supple.  Skin:    General: Skin is warm and dry.     Capillary Refill: Capillary refill takes less  than 2 seconds.  Neurological:     Mental Status: She is alert.  Psychiatric:        Mood and Affect: Mood normal.     (all labs ordered are listed, but only abnormal results are displayed) Labs Reviewed  HEPATIC FUNCTION PANEL  LIPASE, BLOOD  BASIC METABOLIC PANEL WITH GFR  CBC WITH DIFFERENTIAL/PLATELET  URINALYSIS, W/ REFLEX TO CULTURE (INFECTION SUSPECTED)    EKG: None  Radiology: No results found.  {Document cardiac monitor, telemetry assessment procedure when appropriate:32947} Procedures   Medications Ordered in the ED  sodium chloride  0.9 % bolus 500 mL (has no administration in time range)      {Click here for ABCD2, HEART and other calculators REFRESH Note before signing:1}                              Medical Decision Making Amount and/or Complexity of Data Reviewed Labs: ordered. Radiology: ordered.   ***  {Document critical care time when appropriate  Document review of labs and clinical decision tools ie CHADS2VASC2, etc  Document your independent review of radiology images and any outside records  Document your discussion with family members, caretakers and with consultants  Document social determinants of health affecting pt's care  Document your decision making why or why not admission, treatments were needed:32947:::1}   Final diagnoses:  None    ED Discharge Orders     None

## 2024-06-16 NOTE — ED Triage Notes (Addendum)
 Pt arrives ambulatory to ED with decreased appetite and pain to right flank area. Also reports some 'gas pains' states that this started yesterday with some vomiting. Pt is MWF Dialysis states that she has not missed any.

## 2024-06-16 NOTE — ED Notes (Signed)
 Pt/family received d/c paperwork at this time. After going over the paperwork any questions, comments, or concerns were answered to the best of this nurse's knowledge. The pt/family verbally acknowledged the teachings/instructions.

## 2024-06-16 NOTE — Discharge Instructions (Addendum)
 Call and follow-up with general surgery for your gallstones.  Avoid fatty foods or greasy foods until you follow-up with them.  You can use your Zofran  as needed for nausea and vomiting.  Return to the ER for any new or worsening symptoms.

## 2024-06-19 DIAGNOSIS — N186 End stage renal disease: Secondary | ICD-10-CM | POA: Diagnosis not present

## 2024-06-19 DIAGNOSIS — Z992 Dependence on renal dialysis: Secondary | ICD-10-CM | POA: Diagnosis not present

## 2024-06-20 ENCOUNTER — Ambulatory Visit: Admitting: Surgery

## 2024-06-20 ENCOUNTER — Encounter: Payer: Self-pay | Admitting: Surgery

## 2024-06-20 VITALS — BP 147/73 | HR 85 | Temp 98.2°F | Resp 14 | Ht 64.0 in | Wt 202.0 lb

## 2024-06-20 DIAGNOSIS — K8020 Calculus of gallbladder without cholecystitis without obstruction: Secondary | ICD-10-CM

## 2024-06-20 DIAGNOSIS — Z992 Dependence on renal dialysis: Secondary | ICD-10-CM | POA: Diagnosis not present

## 2024-06-20 DIAGNOSIS — N186 End stage renal disease: Secondary | ICD-10-CM | POA: Diagnosis not present

## 2024-06-21 DIAGNOSIS — Z992 Dependence on renal dialysis: Secondary | ICD-10-CM | POA: Diagnosis not present

## 2024-06-21 DIAGNOSIS — N186 End stage renal disease: Secondary | ICD-10-CM | POA: Diagnosis not present

## 2024-06-21 NOTE — Progress Notes (Addendum)
 [facility] History and Physical  Reason for Referral: Cholelithiasis Referring Physician: ED referral  Chief Complaint   New Patient (Initial Visit)     Angel Davis is a 73 y.o. female.  HPI: Patient presents for evaluation of cholelithiasis.  On 7/27, she was seen in the emergency department for right upper quadrant pain that radiated into her back.  At that time she did have nausea with vomiting.  She had eaten some chicken livers earlier in the week.  She was diagnosed with stones while in the emergency department.  This was her first episode and denies ever having pain like this in the past.  Since being discharged from the hospital, she denies any further issues.  Her past medical history significant for end-stage renal disease on dialysis Monday, Wednesday, and Friday, hypertension, heart attack and stroke for which she takes Plavix.  Her surgical history significant for hysterectomy and a laparoscopic appendectomy.  She denies use of tobacco products, illicit drugs, and alcohol.  Past Medical History:  Diagnosis Date   Abnormal EKG    Acute gout 06/29/2014   AKI (acute kidney injury) (HCC) 11/23/2018   12/16-12/31/2019 [facility] : peak Creatinine 4.39   Anemia    H&H of 10.3/33.5 in 12/2011   Cerebral infarction [facility]) 11/23/2018   12/16-12/31/2019 [facility] MRI performed for encephalopathy in the setting of strep pneumoniae bacteremia and acute hypoxic respiratory failure with findings of multifocal subacute infarcts Plavix and high dose statin initiated   CKD (chronic kidney disease)    On dialysis M, W, F   Depression    DEPRESSION 03/13/2008   Qualifier: Diagnosis of  By: Clayton RN, Savanah     Essential hypertension 03/13/2008   Lab: Normal CMet in 12/2011 except creatinine of 1.35    FATIGUE 02/25/2009   Qualifier: Diagnosis of  By: Antonetta MD, Margaret     Gout flare 04/28/2014   HAND PAIN, RIGHT 02/25/2009   Qualifier: Diagnosis of  By: Antonetta MD, Margaret      Hyperlipidemia    Hypertension    Metabolic syndrome X 06/02/2013   Obesity    OBESITY 03/13/2008   Qualifier: Diagnosis of  By: Clayton RN, Savanah     Pansinusitis 11/23/2018   12/16-12/31/2019 [facility] CT revealed complete opacification of bilateral maxillary/ethmoid sinus and right frontal sinus.Effusion within bilateral middle air spaces ENT recommended antibiotics w/o surgical intervention   Pre-diabetes    Prediabetes 01/08/2012   HBa1C is 6.4 in 04/2014    Renal insufficiency 02/01/2012   Respiratory failure with hypoxia (HCC) 11/23/2018   12/16-12/31/2009 [facility] with hypoxic respiratory failure in the context of strep pneumonia bacteremia (on blood cultures collected in [facility] ED) Intubated in [facility] ED and transferred to [facility]     Tachycardia 04/28/2014   Thrombocytosis 11/25/2018   11/16/2018 platelet count 685,000   Vitamin D  deficiency    Vitamin D  deficiency 01/08/2012    Past Surgical History:  Procedure Laterality Date   BREAST BIOPSY Right 11/16/2021   10 oclock/fibrosis   BREAST BIOPSY Right 11/16/2021   axillary/fat necrosis   COLONOSCOPY WITH PROPOFOL  N/A 11/18/2021   Procedure: COLONOSCOPY WITH PROPOFOL ;  Surgeon: Cindie Carlin POUR, DO;  Location: [facility];  Service: Endoscopy;  Laterality: N/A;  2:00pm   POLYPECTOMY  11/18/2021   Procedure: POLYPECTOMY;  Surgeon: Cindie Carlin POUR, DO;  Location: [facility];  Service: Endoscopy;;   TOTAL VAGINAL HYSTERECTOMY      Family History  Problem Relation Age of Onset  Heart attack Mother    Heart attack Father    Cancer Sister    Hypertension Sister    Hypertension Brother    Colon cancer Neg Hx     Social History   Tobacco Use   Smoking status: Never   Smokeless tobacco: Never  Vaping Use   Vaping status: Never Used  Substance Use Topics   Alcohol use: No   Drug use: No    Medications: I have reviewed the patient's current medications. Allergies as of 06/20/2024   No Known  Allergies      Medication List        Accurate as of June 20, 2024 11:59 PM. If you have any questions, ask your nurse or doctor.          acetaminophen 500 MG tablet Commonly known as: TYLENOL Take 500-1,000 mg by mouth every 6 (six) hours as needed for moderate pain.   calcitRIOL 0.25 MCG capsule Commonly known as: ROCALTROL Take 1.25 mcg by mouth. Mon, Wed, Fri   carvedilol 25 MG tablet Commonly known as: COREG Take 25 mg by mouth daily at 6 (six) AM.   cinacalcet 30 MG tablet Commonly known as: SENSIPAR Take 30 mg by mouth every Monday, Wednesday, and Friday.   clopidogrel 75 MG tablet Commonly known as: PLAVIX Take 75 mg by mouth daily.   EPOGEN  IJ Inject as directed. 5000 units Mon, Wed, Fri   escitalopram 10 MG tablet Commonly known as: LEXAPRO Take 10 mg by mouth daily.   methocarbamol  500 MG tablet Commonly known as: ROBAXIN  Take 1 tablet (500 mg total) by mouth 3 (three) times daily as needed (pain).   NIFEdipine  90 MG 24 hr tablet Commonly known as: PROCARDIA  XL/NIFEDICAL-XL Take 1 tablet (90 mg total) by mouth daily.   ondansetron  4 MG tablet Commonly known as: ZOFRAN  Take 1 tablet (4 mg total) by mouth every 8 (eight) hours as needed for up to 4 days for nausea or vomiting.   Tums Ultra 1000 1000 MG chewable tablet Generic drug: calcium elemental as carbonate Chew 1,000 mg by mouth 3 (three) times daily before meals.   UNABLE TO FIND Med Name: Venofer  50mg  once a week at dialysis         ROS:  Constitutional: negative for chills, fatigue, and fevers Eyes: negative for visual disturbance and pain Ears, nose, mouth, throat, and face: negative for ear drainage, sore throat, and sinus problems Respiratory: negative for cough, wheezing, and shortness of breath Cardiovascular: negative for chest pain and palpitations Gastrointestinal: positive for abdominal pain, nausea, and reflux symptoms, negative for vomiting Genitourinary:negative  for dysuria and frequency Integument/breast: negative for dryness and rash Hematologic/lymphatic: negative for bleeding and lymphadenopathy Musculoskeletal:negative for bone pain and neck pain Neurological: negative for dizziness and tremors Endocrine: negative for temperature intolerance  Blood pressure (!) 147/73, pulse 85, temperature 98.2 F (36.8 C), temperature source Oral, resp. rate 14, height 5' 4 (1.626 m), weight 202 lb (91.6 kg), SpO2 92%. Physical Exam Vitals reviewed.  Constitutional:      Appearance: Normal appearance.  HENT:     Head: Normocephalic and atraumatic.  Eyes:     Extraocular Movements: Extraocular movements intact.     Pupils: Pupils are equal, round, and reactive to light.  Cardiovascular:     Rate and Rhythm: Normal rate and regular rhythm.  Pulmonary:     Effort: Pulmonary effort is normal.     Breath sounds: Normal breath sounds.  Abdominal:  Comments: Abdomen soft, nondistended, no percussion tenderness, nontender to palpation; no rigidity, guarding, rebound tenderness; negative Murphy sign  Musculoskeletal:        General: Normal range of motion.     Cervical back: Normal range of motion.  Skin:    General: Skin is warm and dry.  Neurological:     General: No focal deficit present.     Mental Status: She is alert and oriented to person, place, and time.  Psychiatric:        Mood and Affect: Mood normal.        Behavior: Behavior normal.     Results: CT abdomen and pelvis (06/16/2024): IMPRESSION: Small right-sided pleural effusion.   Cholelithiasis without complicating factors.   Diverticulosis without diverticulitis.  Assessment & Plan:  Zeena Starkel Truxillo is a 73 y.o. female who presents for evaluation of cholelithiasis.  -We discussed the pathophysiology of gallbladder disease and the recommendations for cholecystectomy. -I counseled the patient about the indication, risks and benefits of robotic assisted laparoscopic  cholecystectomy.  She understands there is a very small chance for bleeding, infection, injury to normal structures (including common bile duct), conversion to open surgery, persistent symptoms, evolution of postcholecystectomy diarrhea, need for secondary interventions, anesthesia reaction, cardiopulmonary issues and other risks not specifically detailed here. I described the expected recovery, the plan for follow-up and the restrictions during the recovery phase.  All questions were answered. -Patient would like to attempt a low-fat diet to see if this alleviates her symptoms without need for surgery -Information provided to the patient regarding cholelithiasis, cholecystitis, low-fat diet, and cholecystectomy -Advised that she needs to present to the emergency department if she begins to have worsening right upper quadrant abdominal pain, nausea, vomiting, fever, and chills -Follow up with me as needed or if she would like to proceed with surgery -We will also reach out to the patient's cardiologist to obtain her most recent echocardiogram results, as her last echo in our system is in 2013  All questions were answered to the satisfaction of the patient.  Note: Portions of this report may have been transcribed using voice recognition software. Every effort has been made to ensure accuracy; however, inadvertent computerized transcription errors may still be present.   Dorothyann Brittle, DO [facility] [address] [place], [place] [address] [phone] (office)

## 2024-06-24 ENCOUNTER — Encounter: Payer: Self-pay | Admitting: *Deleted

## 2024-06-24 DIAGNOSIS — Z992 Dependence on renal dialysis: Secondary | ICD-10-CM | POA: Diagnosis not present

## 2024-06-24 DIAGNOSIS — N186 End stage renal disease: Secondary | ICD-10-CM | POA: Diagnosis not present

## 2024-06-26 DIAGNOSIS — Z992 Dependence on renal dialysis: Secondary | ICD-10-CM | POA: Diagnosis not present

## 2024-06-26 DIAGNOSIS — N186 End stage renal disease: Secondary | ICD-10-CM | POA: Diagnosis not present

## 2024-06-28 DIAGNOSIS — Z992 Dependence on renal dialysis: Secondary | ICD-10-CM | POA: Diagnosis not present

## 2024-06-28 DIAGNOSIS — N186 End stage renal disease: Secondary | ICD-10-CM | POA: Diagnosis not present

## 2024-06-29 DIAGNOSIS — Z992 Dependence on renal dialysis: Secondary | ICD-10-CM | POA: Diagnosis not present

## 2024-06-29 DIAGNOSIS — N186 End stage renal disease: Secondary | ICD-10-CM | POA: Diagnosis not present

## 2024-07-01 DIAGNOSIS — Z992 Dependence on renal dialysis: Secondary | ICD-10-CM | POA: Diagnosis not present

## 2024-07-01 DIAGNOSIS — N186 End stage renal disease: Secondary | ICD-10-CM | POA: Diagnosis not present

## 2024-07-03 DIAGNOSIS — N186 End stage renal disease: Secondary | ICD-10-CM | POA: Diagnosis not present

## 2024-07-03 DIAGNOSIS — Z992 Dependence on renal dialysis: Secondary | ICD-10-CM | POA: Diagnosis not present

## 2024-07-05 DIAGNOSIS — N186 End stage renal disease: Secondary | ICD-10-CM | POA: Diagnosis not present

## 2024-07-05 DIAGNOSIS — Z992 Dependence on renal dialysis: Secondary | ICD-10-CM | POA: Diagnosis not present

## 2024-07-08 DIAGNOSIS — N186 End stage renal disease: Secondary | ICD-10-CM | POA: Diagnosis not present

## 2024-07-08 DIAGNOSIS — Z992 Dependence on renal dialysis: Secondary | ICD-10-CM | POA: Diagnosis not present

## 2024-07-10 DIAGNOSIS — Z992 Dependence on renal dialysis: Secondary | ICD-10-CM | POA: Diagnosis not present

## 2024-07-10 DIAGNOSIS — N186 End stage renal disease: Secondary | ICD-10-CM | POA: Diagnosis not present

## 2024-07-12 DIAGNOSIS — N186 End stage renal disease: Secondary | ICD-10-CM | POA: Diagnosis not present

## 2024-07-12 DIAGNOSIS — Z992 Dependence on renal dialysis: Secondary | ICD-10-CM | POA: Diagnosis not present

## 2024-07-15 DIAGNOSIS — N186 End stage renal disease: Secondary | ICD-10-CM | POA: Diagnosis not present

## 2024-07-15 DIAGNOSIS — Z992 Dependence on renal dialysis: Secondary | ICD-10-CM | POA: Diagnosis not present

## 2024-07-17 DIAGNOSIS — N186 End stage renal disease: Secondary | ICD-10-CM | POA: Diagnosis not present

## 2024-07-17 DIAGNOSIS — Z992 Dependence on renal dialysis: Secondary | ICD-10-CM | POA: Diagnosis not present

## 2024-07-19 DIAGNOSIS — N186 End stage renal disease: Secondary | ICD-10-CM | POA: Diagnosis not present

## 2024-07-19 DIAGNOSIS — Z992 Dependence on renal dialysis: Secondary | ICD-10-CM | POA: Diagnosis not present

## 2024-07-21 DIAGNOSIS — N186 End stage renal disease: Secondary | ICD-10-CM | POA: Diagnosis not present

## 2024-07-21 DIAGNOSIS — Z992 Dependence on renal dialysis: Secondary | ICD-10-CM | POA: Diagnosis not present

## 2024-07-22 DIAGNOSIS — Z992 Dependence on renal dialysis: Secondary | ICD-10-CM | POA: Diagnosis not present

## 2024-07-22 DIAGNOSIS — N186 End stage renal disease: Secondary | ICD-10-CM | POA: Diagnosis not present

## 2024-07-24 DIAGNOSIS — N186 End stage renal disease: Secondary | ICD-10-CM | POA: Diagnosis not present

## 2024-07-24 DIAGNOSIS — Z992 Dependence on renal dialysis: Secondary | ICD-10-CM | POA: Diagnosis not present

## 2024-07-25 ENCOUNTER — Telehealth: Payer: Self-pay | Admitting: *Deleted

## 2024-07-25 NOTE — Telephone Encounter (Signed)
 Provider requested most recent echo results form cardiology.   Faxed request to: [facility] [address]  [place], [place] [address]  [phone] telephone [phone]~ fax  Transthoracic Echocardiogram Report received from 03/14/2023. Noted results in Care Everywhere

## 2024-07-26 DIAGNOSIS — N186 End stage renal disease: Secondary | ICD-10-CM | POA: Diagnosis not present

## 2024-07-26 DIAGNOSIS — Z992 Dependence on renal dialysis: Secondary | ICD-10-CM | POA: Diagnosis not present

## 2024-07-29 DIAGNOSIS — Z992 Dependence on renal dialysis: Secondary | ICD-10-CM | POA: Diagnosis not present

## 2024-07-29 DIAGNOSIS — N186 End stage renal disease: Secondary | ICD-10-CM | POA: Diagnosis not present

## 2024-07-31 ENCOUNTER — Telehealth: Payer: Self-pay | Admitting: *Deleted

## 2024-07-31 DIAGNOSIS — N186 End stage renal disease: Secondary | ICD-10-CM | POA: Diagnosis not present

## 2024-07-31 DIAGNOSIS — Z992 Dependence on renal dialysis: Secondary | ICD-10-CM | POA: Diagnosis not present

## 2024-07-31 NOTE — Telephone Encounter (Signed)
 Received call from patient [phone]~ telephone.  Patient reports that she is ready to proceed with cholecystectomy.   Patient has dialysis on M W F

## 2024-08-01 NOTE — Telephone Encounter (Signed)
 Discussed with provider.   Requested to obtain cardiac clearance and authorization to hold Plavix.   Request sent to: [facility] [address]  [place], [place] [address]  [phone] telephone [phone]~ fax

## 2024-08-02 DIAGNOSIS — Z992 Dependence on renal dialysis: Secondary | ICD-10-CM | POA: Diagnosis not present

## 2024-08-02 DIAGNOSIS — N186 End stage renal disease: Secondary | ICD-10-CM | POA: Diagnosis not present

## 2024-08-02 NOTE — Telephone Encounter (Signed)
 Received return fax from [facility]. Reports that patient has not followed up with their office since 2023. Patient will need OV for clearance.   Call placed to patient and patient made aware. States that she will schedule appointment with cardiology.

## 2024-08-05 DIAGNOSIS — Z992 Dependence on renal dialysis: Secondary | ICD-10-CM | POA: Diagnosis not present

## 2024-08-05 DIAGNOSIS — N186 End stage renal disease: Secondary | ICD-10-CM | POA: Diagnosis not present

## 2024-08-07 DIAGNOSIS — Z992 Dependence on renal dialysis: Secondary | ICD-10-CM | POA: Diagnosis not present

## 2024-08-07 DIAGNOSIS — N186 End stage renal disease: Secondary | ICD-10-CM | POA: Diagnosis not present

## 2024-08-09 DIAGNOSIS — Z992 Dependence on renal dialysis: Secondary | ICD-10-CM | POA: Diagnosis not present

## 2024-08-09 DIAGNOSIS — N186 End stage renal disease: Secondary | ICD-10-CM | POA: Diagnosis not present

## 2024-08-12 DIAGNOSIS — I5022 Chronic systolic (congestive) heart failure: Secondary | ICD-10-CM | POA: Diagnosis not present

## 2024-08-14 DIAGNOSIS — N186 End stage renal disease: Secondary | ICD-10-CM | POA: Diagnosis not present

## 2024-08-14 DIAGNOSIS — Z992 Dependence on renal dialysis: Secondary | ICD-10-CM | POA: Diagnosis not present

## 2024-08-16 DIAGNOSIS — Z992 Dependence on renal dialysis: Secondary | ICD-10-CM | POA: Diagnosis not present

## 2024-08-16 DIAGNOSIS — N186 End stage renal disease: Secondary | ICD-10-CM | POA: Diagnosis not present

## 2024-08-19 DIAGNOSIS — N186 End stage renal disease: Secondary | ICD-10-CM | POA: Diagnosis not present

## 2024-08-19 DIAGNOSIS — G4730 Sleep apnea, unspecified: Secondary | ICD-10-CM | POA: Diagnosis not present

## 2024-08-19 DIAGNOSIS — Z992 Dependence on renal dialysis: Secondary | ICD-10-CM | POA: Diagnosis not present

## 2024-08-20 DIAGNOSIS — Z992 Dependence on renal dialysis: Secondary | ICD-10-CM | POA: Diagnosis not present

## 2024-08-20 DIAGNOSIS — N186 End stage renal disease: Secondary | ICD-10-CM | POA: Diagnosis not present

## 2024-09-04 NOTE — Telephone Encounter (Signed)
 Received cardiac clearance from [facility]. 1. Heart failure with mildly reduced ejection fraction. The patient has a history of mildly reduced LV systolic function with EF 45-50%. This is nonischemic. She had a coronary angiogram showing minimal CAD in 2023. Today, she seems euvolemic and well compensated. She is currently being treated with carvedilol and antihypertensive therapy. Her volume is managed with dialysis. She seems euvolemic and well compensated today. Her blood pressure is normal.  2. ESRD. The patient goes dialysis via fistula in the left upper arm. She has no cardiac symptoms during dialysis.  3. Preoperative evaluation. The patient tells me that she has disease of her gallbladder and needs to have a cholecystectomy in the coming weeks. From a cardiology standpoint, she can proceed with a cholecystectomy with a low risk of cardiac complications. The anesthesia service should be aware of her mildly reduced LV systolic function and should treat accordingly. She takes her clopidogrel because she had a stroke, not for cardiac indication. From a cardiac standpoint, she can hold her clopidogrel for surgery with a low risk of cardiac complications.  Angel Sieving, DO  Discussed with provider and dates obtained.   Call placed to patient. Reports that she would prefer to defer surgery at this time. States that she has not had another attack since changing her diet.   States that she will call back if needed.
